# Patient Record
Sex: Female | Born: 1935 | Race: White | Hispanic: No | Marital: Married | Smoking: Never smoker
Health system: Southern US, Community
[De-identification: ages and names within clinical notes are randomized; demographics above are authoritative.]

## PROBLEM LIST (undated history)

## (undated) DIAGNOSIS — R011 Cardiac murmur, unspecified: Secondary | ICD-10-CM

## (undated) DIAGNOSIS — I739 Peripheral vascular disease, unspecified: Secondary | ICD-10-CM

## (undated) DIAGNOSIS — R0602 Shortness of breath: Secondary | ICD-10-CM

## (undated) DIAGNOSIS — K589 Irritable bowel syndrome without diarrhea: Secondary | ICD-10-CM

## (undated) DIAGNOSIS — G629 Polyneuropathy, unspecified: Secondary | ICD-10-CM

## (undated) DIAGNOSIS — R2681 Unsteadiness on feet: Secondary | ICD-10-CM

## (undated) DIAGNOSIS — E785 Hyperlipidemia, unspecified: Secondary | ICD-10-CM

## (undated) DIAGNOSIS — M1990 Unspecified osteoarthritis, unspecified site: Secondary | ICD-10-CM

## (undated) DIAGNOSIS — K5790 Diverticulosis of intestine, part unspecified, without perforation or abscess without bleeding: Secondary | ICD-10-CM

## (undated) DIAGNOSIS — G4700 Insomnia, unspecified: Secondary | ICD-10-CM

## (undated) DIAGNOSIS — F419 Anxiety disorder, unspecified: Secondary | ICD-10-CM

## (undated) HISTORY — PX: CATARACT EXTRACTION: SUR2

## (undated) HISTORY — PX: OTHER SURGICAL HISTORY: SHX169

## (undated) HISTORY — PX: CHOLECYSTECTOMY: SHX55

## (undated) HISTORY — PX: ABDOMINAL HYSTERECTOMY: SHX81

---

## 1998-02-05 ENCOUNTER — Other Ambulatory Visit: Admission: RE | Admit: 1998-02-05 | Discharge: 1998-02-05 | Payer: Self-pay | Admitting: Family Medicine

## 1998-06-06 ENCOUNTER — Ambulatory Visit (HOSPITAL_COMMUNITY): Admission: RE | Admit: 1998-06-06 | Discharge: 1998-06-06 | Payer: Self-pay | Admitting: Family Medicine

## 1999-05-02 ENCOUNTER — Other Ambulatory Visit: Admission: RE | Admit: 1999-05-02 | Discharge: 1999-05-02 | Payer: Self-pay | Admitting: Family Medicine

## 2000-02-23 ENCOUNTER — Emergency Department (HOSPITAL_COMMUNITY): Admission: EM | Admit: 2000-02-23 | Discharge: 2000-02-23 | Payer: Self-pay | Admitting: Emergency Medicine

## 2000-02-27 ENCOUNTER — Encounter (INDEPENDENT_AMBULATORY_CARE_PROVIDER_SITE_OTHER): Payer: Self-pay

## 2000-02-27 ENCOUNTER — Inpatient Hospital Stay (HOSPITAL_COMMUNITY): Admission: EM | Admit: 2000-02-27 | Discharge: 2000-03-02 | Payer: Self-pay | Admitting: *Deleted

## 2000-02-28 ENCOUNTER — Encounter: Payer: Self-pay | Admitting: *Deleted

## 2000-02-29 ENCOUNTER — Encounter: Payer: Self-pay | Admitting: *Deleted

## 2000-05-05 ENCOUNTER — Encounter (INDEPENDENT_AMBULATORY_CARE_PROVIDER_SITE_OTHER): Payer: Self-pay | Admitting: Specialist

## 2000-05-05 ENCOUNTER — Ambulatory Visit (HOSPITAL_COMMUNITY): Admission: RE | Admit: 2000-05-05 | Discharge: 2000-05-05 | Payer: Self-pay | Admitting: Gastroenterology

## 2002-04-01 ENCOUNTER — Emergency Department (HOSPITAL_COMMUNITY): Admission: EM | Admit: 2002-04-01 | Discharge: 2002-04-01 | Payer: Self-pay | Admitting: Emergency Medicine

## 2002-04-01 ENCOUNTER — Encounter: Payer: Self-pay | Admitting: *Deleted

## 2004-12-24 ENCOUNTER — Ambulatory Visit (HOSPITAL_COMMUNITY): Admission: RE | Admit: 2004-12-24 | Discharge: 2004-12-24 | Payer: Self-pay | Admitting: Gastroenterology

## 2007-04-24 ENCOUNTER — Emergency Department (HOSPITAL_COMMUNITY): Admission: EM | Admit: 2007-04-24 | Discharge: 2007-04-24 | Payer: Self-pay | Admitting: Emergency Medicine

## 2007-05-04 ENCOUNTER — Emergency Department (HOSPITAL_COMMUNITY): Admission: EM | Admit: 2007-05-04 | Discharge: 2007-05-04 | Payer: Self-pay | Admitting: Emergency Medicine

## 2010-03-05 ENCOUNTER — Encounter: Admission: RE | Admit: 2010-03-05 | Discharge: 2010-03-05 | Payer: Self-pay | Admitting: Internal Medicine

## 2010-11-14 NOTE — H&P (Signed)
[facility]  Patient:    Marie Cannon, Marie Cannon                        MRN: [id number] Adm. Date:  [id number] Attending:  Feliciana Rossetti                         History and Physical  CHIEF COMPLAINT:  "I turned yellow."  HISTORY OF PRESENT ILLNESS:  A 77 year old white female woke up this date of admission feeling mild malaise and was otherwise without complaints when she presented to her dentist, who stated that she was yellow and that she needed to proceed to her primary care physician right away.  The patient presented to my office on the morning of admission and upon questioning revealed that she had presented to the emergency room roughly six days earlier, where she was diagnosed with gastroesophageal reflux disease and treated with Protonix.  I had spoken with the emergency room doctor at this time.  Her presenting complaint to the emergency room was that of chest pain and chest squeezing. The patient denies nausea, vomiting, diarrhea.  She does endorse pale stools and Coca-Cola colored urine.  She denies headaches, focal numbness, paresthesias, or weakness.  PAST MEDICAL HISTORY:  Remote appendicitis.  PAST SURGICAL HISTORY:  Hysterectomy, appendectomy.  MEDICATIONS:  Premarin, Protonix.  ALLERGIES:  CODEINE.  REVIEW OF SYSTEMS:  No diplopia, no other visual changes.  No skin breakdown. See HPI.  PHYSICAL EXAMINATION:  GENERAL:  Nontoxic, in no apparent distress.  HEENT:  Pupils are equal, round and reactive to light.  Funduscopic exam reveals no exudate or hemorrhage, and normal disc edges.  The sclerae are icteric.  Mucous membranes are moist.  Oropharynx is without lesion.  NECK:  Supple.  No carotid bruits.  LYMPHATIC:  Lymph node survey negative.  CARDIOVASCULAR:  Regular rate and rhythm, no murmur, rub, or gallop.  CHEST:  Clear to auscultation.  Good, symmetric air movement.  ABDOMEN:  Soft, no splenomegaly.  Normal bowel sounds.   No rebound, no guard. Tenderness is palpated in right upper quadrant.  PELVIC, BREAST:  Deferred.  NEUROLOGIC:  Cranial nerves II-XII and muscle strength are within normal limits.  DTRs are 1 and symmetric.  RECTAL:  No occult blood and normal tone.  LABORATORY DATA:  Total bilirubin is elevated.  CBC and differential are otherwise normal.  Hepatic profile is pending at this time.  Liver enzymes are elevated.  Ultrasound reveals stone in biliary tree and multiple stones in the gallbladder.  ASSESSMENT AND PLAN: 1. Cholelithiasis.  Dr. Orson Slick consulted for planned surgical removal. 2. Choledocholithiasis, cholelithiasis.  Plan for ERCP by Dr. Loreta Ave. 3. Hepatitis.  Suspect strongly noninfectious and will await infectious    profile. DD:  02/28/00 TD:  03/01/00 Job: [id number]

## 2010-11-14 NOTE — Procedures (Signed)
[place]  Patient:    Marie Cannon, Marie Cannon                        MRN: [id number] Proc. Date: 05/05/00 Adm. Date:  [id number] Attending:  Charna Elizabeth CC:         Feliciana Rossetti, M.D.  Zigmund Daniel, M.D.   Procedure Report  DATE OF BIRTH:  1925-07-02  REFERRING PHYSICIAN:  Feliciana Rossetti, M.D.  PROCEDURE PERFORMED:  Colonoscopy with biopsies.  ENDOSCOPIST:  Anselmo Rod, M.D.  INSTRUMENT USED:  Olympus video colonoscope.  INDICATIONS FOR PROCEDURE:  Screening colonoscopy being performed in a 77 year old white female rule out colonic polyps, masses, hemorrhoids, etc.  PREPROCEDURE PREPARATION:  Informed consent was procured from the patient. The patient was fasted for eight hours prior to the procedure and prepped with a bottle of magnesium citrate and a gallon of NuLytely the night prior to the procedure.  PREPROCEDURE PHYSICAL:  The patient had stable vital signs.  Neck supple. Chest clear to auscultation.  S1, S2 regular.  Abdomen soft with normal abdominal bowel sounds.  DESCRIPTION OF PROCEDURE:  The patient was placed in the left lateral decubitus position and sedated with 100 mg of Demerol and 10 mg of Versed intravenously.  Once the patient was adequately sedated and maintained on low-flow oxygen and continuous cardiac monitoring, the Olympus video colonoscope was advanced from the rectum to the cecum without difficulty. The patient had some general fullness in the cecum around the appendicular orifice.  Biopsies were done from the appendicular orifice and from the cecal base for pathology.  No frank masses were seen.  It is important to rule out carcinoid syndrome under these circumstances.  Patient had prominent external hemorrhoids and small internal hemorrhoids.  IMPRESSION: 1. General fullness with prominent folds in the cecal base.  Biopsies done.    results pending. 2. Prominent external and small nonbleeding  internal hemorrhoids.  RECOMMENDATIONS: 1. Await pathology results. 2. Increase fluid and fiber in the diet. 3. Outpatient follow-up in the next two weeks. DD:  05/06/00 TD:  05/06/00 Job: [id number]

## 2010-11-14 NOTE — Consult Note (Signed)
[place]  Patient:    Marie Cannon, Marie Cannon                        MRN: [id number] Proc. Date: 02/27/00 Adm. Date:  [id number] Attending:  Meredith Leeds CC:         Feliciana Rossetti, M.D.  Zigmund Daniel, M.D.   Consultation Report  DATE OF BIRTH:  [birth year].  REASON FOR CONSULTATION:  Gallstones with stone in common bile duct.  Patient needs an ERCP before laparoscopic cholecystectomy.  ASSESSMENT: 1. Cholelithiasis with choledocholithiasis and CBD dilatation with jaundice. 2. Status post hysterectomy in 1977. 3. Status post appendectomy at age 49.  RECOMMENDATIONS: 1. Agree with ERCP prior to laparoscopic cholecystectomy 2. Clear liquid diet, n.p.o. after midnight for ERCP tomorrow. 3. Check UA, and PT and PTT.  DISCUSSION:  [name] is a very pleasant 77 year old white female who was in her usual state of health until Sunday, February 23, 2000, when she started experiencing some chest pain.  The patient was subsequently evaluated in the emergency room on February 24, 2000, for an MI, and had basic labs and EKG to rule out an MI.  The patient was advised that her symptoms may be secondary to GERD and was prescribed protonix.  The patient took the protonix for a day and noted some dark urine that night.  She also noted some clay colored stools the next morning.  The patient then presented to her dentist this morning who noticed her eyes to be jaundiced.  The patient was then seen on an emergent basis by Dr. Barbarann Ehlers in his office and sent for emergent ultrasound which showed that she had multiple gallstones, severe dilatation of up to 12 mm and a small stone in the mid common bile duct.  The patient denies any nausea and vomiting.  She did experience some right upper quadrant pain with rib soreness on the right side with radiation to the back.  She denies a previous history of ulcers, jaundice or colitis.  There is no history  of ______ or melena.  She averages 1 to 2 bowel movements per day without blood or mucous.  She denies a definite change in bowel habits.  There is no history of blood transfusions or tattoos.  There are no genitourinary or  cardiorespiratory complaints except for some urinary hesitancy which seems to have started today.  She has no history of headaches, there is no syncope.  There are ENT, dental or ocular complaints.  She denies any musculoskeletal, vascular, or neurologic problems.  ALLERGIES:  She is allergic to CODEINE which makes her nauseous.  MEDICATIONS: 1. Premarin 1.25 mg p.o. q. day. 2. Protonix 14 mg p.o. q. day. 3. Valium 5 mg q.h.s. 4. Corrector last night on a p.r.n. basis.  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  She has 2 grown children.  She denies the use of alcohol, tobacco or drugs.  Her husband is retired form an Scientist, forensic and they live in [place]. They have been married for 52 years.  FAMILY HISTORY:  The patients father died of a stroke in his 47s and the mother died of congestive heart failure in her 24s.  The patient denies a family history of cancer, heart disease or diabetes.  REVIEW OF SYSTEMS: 1. Yellow jaundice. 2. Right upper quadrant abdominal pain radiating to the back. 3. Dark colored urine, light colored  stools. 4. Some nausea without vomiting.  PHYSICAL EXAMINATION:  GENERAL:  Reveals a very pleasant, elderly white female, laying comfortably in bed.  VITAL SIGNS:  Temperature 97.2, blood pressure 173/77, respiratory rate 20, heart rate of 83 per minute.  Height of 5 feet 7 inches.  Weight of 174 pounds.  HEENT:  PERRLA.  Facial symmetry preserved.  The patient has icteric sclera. Oropharyngeal mucosa without exudate.  NECK:  Supple, no JVD, thyromegaly, lymphadenopathy.  CHEST:  Clear to auscultation.  S1, S2 regular.  No murmur, rub or gallop.  LUNGS:  No rhonchi or wheezing.  ABDOMEN:  Soft  with an appendectomy scar present right lower quadrant and a hysterectomy scar present transversely over the suprapubic area.  No significant tenderness on palpation of right right upper quadrant with no guarding, rebound in the region of hepatosplenomegaly, no masses palpable.  RECTAL:  Small internal hemorrhoid and guaiac negative brown stools.  No other masses are palpable on rectal exam.  PERIPHERAL PULSES: Normal.  CNS:  Nonfocal.  LABORATORY DATA:  Revealed white count of 8.6 k, hemoglobin 14.6 ______, hematocrit 40.4, MCV 86.5, platelet 286, 71% neutrophils, sodium 138, potassium 3.5, chloride 104, CO2 28, glucose 116, BUN 9, creatinine 0.9, total bilirubin 8.7, alk. phos. 227, AST 82, ALT 91, total protein 6.7, albumin 3.1, calcium 8.8.  Abdominal ultrasound done today shows multiple stones in the gallbladder with a 12 mm common bile duct and a small stone in the mid common bile duct.  There is minimal intrahepatic ductal dilatation of the left lobe.  Considering this data recommendations have been made.  Further recommendation will be made once the ERCP is done. DD:  02/27/00 TD:  03/01/00 Job: [id number]

## 2010-11-14 NOTE — Discharge Summary (Signed)
[facility]  Patient:    Marie Cannon, Marie Cannon                        MRN: [id number] Adm. Date:  [id number] Disc. Date: [id number] Attending:  Meredith Leeds CC:         Roosvelt Harps, M.D.  Petra Kuba, M.D.  Feliciana Rossetti, M.D.   Discharge Summary  HISTORY:  The patient is a 77 year old white female who was admitted because of severe upper abdominal pain.  Gallbladder ultrasound showed gallstones in the large common bile duct and stone in the bile duct.  The patient was jaundiced.  Dr. Quintella Reichert put her in the hospital and provided pain medication and antibiotics and consulted me.  The patient had mild upper abdominal tenderness.  She was obviously icteric.  HOSPITAL COURSE:  I requested that Dr. Loreta Ave see the patient because of the jaundice and obvious gallstones, but possibility of other causes of jaundice. Dr. Loreta Ave and I agreed that the patient needed preoperative ERCP. Dr. Luther Parody was engaged to do that and performed the procedure on September 1.  Sphincterotomy was performed, but he was unsuccessful in removal of the mid common duct stone.  Dr. Ewing Schlein repeated the procedure on September 2 and was able to remove the stones from the common duct.  I took the patient to the operating room on September 3 and did a laparoscopic cholecystectomy.  The procedure was difficult because of a great deal of chronic inflammation.  I was able to perform a cholangiogram which showed that the common duct was cleared well.  The patients jaundice abated.  On the morning after surgery, she was feeling well, feeding, and desiring discharge.  She had a drain in which I left in to be removed in the office in a few days.  This was present because of difficulty of dissection and uncertainty about control of the cystic duct.  It was not leaking any bile at the time of discharge.  Arrangements were made for her to see me or one of my associates in a few  days.  DISCHARGE DIAGNOSES: 1. Cholelithiasis. 2. choledocholithiasis. 3. Acute on chronic cholecystitis.  OPERATIONS: 1. ERCP x 2. 2. Laparoscopic cholecystectomy with cholangiogram.  DISCHARGE CONDITION:  Improved. DD:  03/10/00 TD:  03/11/00 Job: [id number]

## 2010-11-14 NOTE — Op Note (Signed)
Marie Cannon, Marie Cannon                  ACCOUNT NO.:  [id number]   MEDICAL RECORD NO.:  [id number]          PATIENT TYPE:  AMB   LOCATION:  ENDO                         FACILITY:  [facility]   PHYSICIAN:  Anselmo Rod, M.D.  DATE OF BIRTH:  1925-07-21   DATE OF PROCEDURE:  12/24/2004  DATE OF DISCHARGE:                                 OPERATIVE REPORT   PROCEDURE PERFORMED:  Screening colonoscopy.   ENDOSCOPIST:  Anselmo Rod, M.D.   INSTRUMENT USED:  Olympus video colonoscope.   INDICATIONS FOR PROCEDURE:  A 77 year old white female with a history of  rectal bleeding, rule out colonic polyps, masses, etc.   PREPROCEDURE PREPARATION:  Informed consent was procured from the patient.  The patient fasted for eight hours prior to the procedure and prepped with a  bottle of magnesium citrate and a gallon of GoLYTELY the night prior to the  procedure.  Risks and benefits of the procedure including a 10% miss rate of  cancer and polyps was discussed with the patient as well.   PREPROCEDURE PHYSICAL:  VITAL SIGNS:  Stable vital signs.  NECK:  Supple.  CHEST:  Clear to auscultation.  CARDIOVASCULAR:  S1 and S2 regular.  ABDOMEN:  Soft with normal bowel sounds.   DESCRIPTION OF PROCEDURE:  The patient was placed in left lateral decubitus  position, sedated with 75 mg of Demerol and 7.5 mg of Versed in slow  incremental doses.  Once the patient was adequately sedated and maintained  on low flow oxygen and continuous cardiac monitoring, the Olympus video  colonoscope was advanced from the rectum to the cecum.  The appendiceal  orifice and ileocecal valve were clearly visualized and photographed.  Small  internal hemorrhoids were seen.  No other masses or polyps were identified.  There were a few diverticula in the sigmoid colon.  The patient tolerated  the procedure well without any immediate complications.   IMPRESSION:  1.  Few sigmoid diverticula.  2.  Small internal hemorrhoids.  3.   Otherwise normal colonoscopy up to the cecum.   RECOMMENDATIONS:  1.  Continue a high fiber diet with liberal fluid intake.  2.  Repeat colonoscopy in the next 10 years unless the patient develops any      abnormal symptoms in the interim.  3.  Outpatient follow-up as need arises in the future.       JNM/MEDQ  D:  12/24/2004  T:  12/24/2004  Job:  [id number]   cc:   Barry Dienes. Eloise Harman, M.D.  [address]  [place]  [place] [address]  Fax: [phone]

## 2010-11-14 NOTE — Procedures (Signed)
[facility]  Patient:    Marie Cannon, Marie Cannon                        MRN: [id number] Proc. Date: 02/29/00 Adm. Date:  [id number] Attending:  Meredith Leeds CC:         Feliciana Rossetti, M.D.             Zigmund Daniel, M.D.             Roosvelt Harps, M.D.             Anselmo Rod, M.D.                           Procedure Report  PROCEDURE:  Endoscopic retrograde cholangiopancreatography with multiple stone extractions, mechanical lithotriptor, balloon pull-throughs.  Consent was signed risks, benefits, methods, and options were thoroughly discussed with the patient by Dr. Luther Parody on multiple occasions.  MEDICINES USED:  Demerol 190, Versed 18, droperidol 5.  DESCRIPTION OF PROCEDURE:  The video therapeutic duodenoscope was inserted by indirect vision into the stomach and advanced into the duodenum. The ampulla and the stent were brought into view which was easily snared and the stent was removed. We then went ahead and advanced the mechanical lithotriptor and injected dye through that and at least 2 common bile duct stones were seen. We then spent the better part of the next 2 hours trying to capture the stones in the basket and once pieces were removed sometimes requiring mechanical lithotriptor other times small pieces and debris were removed with the basket. We were able to finally clear the duct. We did use the Olympus mechanical lithotriptor on multiple occasions and one time used the [employer] conquest system with crushing small to medium sized stones and fragments on multiple occasions. Periodically in between multiple mechanical lithotriptors, we passed due to the 12 mm or the 8.5 mm balloons and debris and some sludge were removed on multiple ones of those. We still had one moderate sized stone present which did take lots of perseverance, but finally we were able to trap it in the basket and get adequate crushing. Three to four  normal balloon pull throughs were done. Although to get complete occlusion cholangiograms, it was difficult because of air remaining in the duct that we could not get out. On multiple balloon pull-throughs and occlusion cholangiograms at the end, no obvious residual stone was seen. Dr. Roxy Horseman of radiology assisted for part of the procedure as did Dr. Luther Parody. The scope was removed. The patient tolerated the procedure well and there was no obvious or immediate complication.  ENDOSCOPIC DIAGNOSIS: 1. Status post stent removal with the snare. 2. Two common bile duct stones status post multiple mechanical    lithotriptoring using both the [employer] system as well as the [employer] conquest system and also multiple balloons 8.5 and 12 mm. 3. No PD injections. 4. Believe only air inducted in the procedure.  PLAN:  Observed for delayed complications if none lap chole tomorrow. I have discussed the case with Dr. Orson Slick and Dr. Luther Parody. If a common bile duct stone was seen on intraop cholangiogram, I would not open her duct because I feel comfortable that we could be able to remove it at a later date endoscopically. I have discussed all of the above with the family who understands and will also tell him no aspirins or nonsteroidals  for 2 weeks. DD:  02/29/00 TD:  03/02/00 Job: [id number]

## 2010-11-14 NOTE — Procedures (Signed)
[facility]  Patient:    Marie Cannon, Marie Cannon                        MRN: [id number] Proc. Date: 02/28/00 Adm. Date:  [id number] Attending:  Meredith Leeds CC:         Feliciana Rossetti, M.D.  Zigmund Daniel, M.D.  Anselmo Rod, M.D.   Procedure Report  PROCEDURE:  ERCP.  SURGEON:  Roosvelt Harps, M.D.  INDICATIONS:  Obstructive jaundice and common bile duct stones in a 77 year old female.  PREPARATION:  She is n.p.o. since midnight.  PREPROCEDURE SEDATION:  She received a total of 150 mg of Demerol, 14 mg of Versed, and 5 mg of droperidol during the procedure to maintain sedation.  In addition, she was on 2 L of nasal cannula O2, and was premedicated with 2 g of IV cefotetan.  For small bowel relaxation, she received 1 mg of Glucagon and 0.5 mg of Levsin.  In addition, her throat was anesthetized with Hurricaine spray.  PREPROCEDURE ASSESSMENT:  I reviewed with the patient and her family the indications for the procedure, and the options of removal of these stones endoscopically versus an open cholecystectomy with common bile duct exploration.  I also discussed with them the potential risks of bleeding perforation and pancreatitis which can be very severe.  They all agreed to proceed.  PREPROCEDURE PHYSICAL EXAMINATION:  Revealed her to have a clear chest, normal heart sounds, and obese and soft abdomen with minimal epigastric and right upper quadrant tenderness to deep palpation.  DESCRIPTION OF PROCEDURE:  Following the above sedation, the patient was positioned on her stomach.  The Olympus video duodenoscope was inserted via the mouth and advanced easily through the upper esophageal sphincter. Intubation was then carried out to the descending duodenum.  The scope was withdrawn for visualization of the ampulla.  The ampulla was easily cannulated with the Tritome catheter.  However, multiple brief pancreatic injections were made  before the common bile duct was cannulated.  No secondary pancreatic ducts were filled however.  The guidewire was advanced well into the left hepatic duct and over this, the Tritome catheter was advanced for better injection into the common bile duct.  Three probable common bile duct stones were visualized.  A moderate-sized sphincterotomy was performed and a balloon catheter exchange with a 12 mm balloon was done.  The balloon catheter was advanced over the guidewire to the bifurcation of the duct, and dye was injected.  As it was withdrawn, the balloon was inflated and multiple attempts were made to extract the stone individually without success.  The catheter was again exchanged for the Tritome catheter, and the sphincterotomy was extended. A 15 mm balloon catheter was inserted and similarly, there was no success in removing the stones.  The catheter was again exchanged for the Tritome, and the sphincterotomy was extended one more time.  A 12 mm balloon catheter was finally exchanged and only the distal stone could be retrieved from the common bile duct.  The largest stone would not move pass the distal common bile duct despite multiple attempts at removal.  At this point, I elected to insert a 10-French, 5 cm stent which was placed without difficulty.  There was good bile drainage.  The patient will be kept on antibiotics and GI colleagues will be consulted regarding lithotripsy and further attempts at removal.  IMPRESSION:  Endoscopic retrograde cholangiopancreatography with sphincterotomy.  Successful only in removing  one of three common bile duct stones, stent left in place.  PLAN:  The patient will remain on a third generation cephalosporin and Flagyl, and be observed while the physicians involved in her care decided about further attempts at stone removal. DD:  02/28/00 TD:  03/01/00 Job: [id number]

## 2010-11-14 NOTE — Op Note (Signed)
[facility]  Patient:    Marie Cannon, Marie Cannon                        MRN: [id number] Proc. Date: 03/01/00 Adm. Date:  [id number] Attending:  Meredith Leeds CC:         Roosvelt Harps, M.D.             Petra Kuba, M.D.             Feliciana Rossetti, M.D.             Anselmo Rod, M.D.                           Operative Report  PREOPERATIVE DIAGNOSES:  Cholelithiasis and chronic cholecystitis.  POSTOPERATIVE DIAGNOSES:  Cholelithiasis and chronic cholecystitis.  OPERATION PERFORMED:  Laparoscopic cholecystectomy and cholangiogram.  SURGEON:  Dr. Orson Slick.  ASSISTANT:  Dr. Magnus Ivan.  ANESTHESIA:  General.  DESCRIPTION OF PROCEDURE:  After adequate monitoring and induction of anesthesia on routine preparation and draping of the abdomen, I made a short transverse infraumbilical incision, dissected down to the fascia, opened it sharply and opened the peritoneum bluntly and entered the abdominal cavity. There were some adhesions toward the lower abdomen but none in the upper abdomen which could be palpated. I placed an #0 Vicryl pursestring suture in the fascia and secured the Hasson cannulated and inflated the abdomen with CO2. I then looked around and noticed that there were adhesions to the under surface of the gallbladder mostly omentum. I placed 3 additional ports and with the patient positioned head up, foot down and tilted to the left, I took down some of the adhesions until I was able to grasp the gallbladder. The gallbladder was very contracted, filled with stones and very inflamed. Dissection both along the surface of the gallbladder and in the hepatoduodenal ligament was difficult. I incised the hepatoduodenal ligament, retracting the infundibulum of the gallbladder laterally. I was able to identify the cystic artery and clip it with 3 clips and divided it between the 2 closer to the gallbladder. It was difficult to dissect out the cystic  duct. I stayed up high on the infundibulum of the gallbladder in that dissection and in doing so made a hole in the infundibulum of the gallbladder and removed several gallstones. I put a cholangiogram catheter through that hole and down into the cystic duct and secured it and did a cholangiogram. It showed that the cystic duct had plenty of length and that there was good filling of the intrahepatic radicles and rapid drainage into the duodenum. I could not identify any retained gallstone fragments in the common bile duct. I removed the cholangiogram catheter and then dissected further. I was finally able to encircle the cystic duct and as I was attempting to clip it, it divided spontaneously. Dr. Magnus Ivan scrubbed in and assisted me with the operation and we very carefully dissected a little farther down along the cystic duct. I picked it up and applied 2 clips taking care to avoid any injury to the common bile duct. I then dissected the gallbladder from the liver with a good deal of difficulty because of all the scar tissue. I made several holes in the gallbladder and removed several gallstones and do not believe I left any stones in the abdomen. I removed the gallbladder from the body through the umbilical incision after placing it into  a plastic pouch and then I put the umbilical port back in and put the camera back in the umbilical port, copiously irrigated the right upper quadrant and removed the irrigant. I got hemostasis in the gallbladder fossa with the Bovie. I checked to see that all the clips were secure. I placed a round Blake drain into the abdomen and brought it out through one of the lateral port sites and secured it to the stem. I had pre-cut it to the necessary length and I placed it down into the gallbladder fossa in a comfortable position. After being assured that that was in good position, I released the CO2 and removed the ports and tied the pursestring suture in the  umbilicus. I anesthetized all the incisions. I closed the skin of all incisions with intracuticular 4-0 Vicryl and Steri-Strips. The patient tolerated the operation well. She went to PACU in stable condition. DD:  03/01/00 TD:  03/02/00 Job: [id number]

## 2012-03-02 ENCOUNTER — Observation Stay (HOSPITAL_COMMUNITY)
Admission: EM | Admit: 2012-03-02 | Discharge: 2012-03-03 | Disposition: A | Discharge status: Home / Self Care | Payer: Medicare Other | Attending: Internal Medicine | Admitting: Internal Medicine

## 2012-03-02 ENCOUNTER — Emergency Department (HOSPITAL_COMMUNITY): Payer: Medicare Other

## 2012-03-02 ENCOUNTER — Encounter (HOSPITAL_COMMUNITY): Payer: Self-pay | Admitting: *Deleted

## 2012-03-02 DIAGNOSIS — F419 Anxiety disorder, unspecified: Secondary | ICD-10-CM | POA: Diagnosis present

## 2012-03-02 DIAGNOSIS — R2681 Unsteadiness on feet: Secondary | ICD-10-CM | POA: Diagnosis present

## 2012-03-02 DIAGNOSIS — I739 Peripheral vascular disease, unspecified: Secondary | ICD-10-CM

## 2012-03-02 DIAGNOSIS — R42 Dizziness and giddiness: Secondary | ICD-10-CM | POA: Diagnosis present

## 2012-03-02 DIAGNOSIS — R0609 Other forms of dyspnea: Principal | ICD-10-CM | POA: Insufficient documentation

## 2012-03-02 DIAGNOSIS — K589 Irritable bowel syndrome without diarrhea: Secondary | ICD-10-CM | POA: Diagnosis present

## 2012-03-02 DIAGNOSIS — G4700 Insomnia, unspecified: Secondary | ICD-10-CM | POA: Diagnosis present

## 2012-03-02 DIAGNOSIS — F411 Generalized anxiety disorder: Secondary | ICD-10-CM | POA: Insufficient documentation

## 2012-03-02 DIAGNOSIS — E785 Hyperlipidemia, unspecified: Secondary | ICD-10-CM

## 2012-03-02 DIAGNOSIS — R55 Syncope and collapse: Secondary | ICD-10-CM

## 2012-03-02 DIAGNOSIS — R0600 Dyspnea, unspecified: Secondary | ICD-10-CM | POA: Diagnosis present

## 2012-03-02 DIAGNOSIS — R0989 Other specified symptoms and signs involving the circulatory and respiratory systems: Principal | ICD-10-CM | POA: Insufficient documentation

## 2012-03-02 DIAGNOSIS — R0602 Shortness of breath: Secondary | ICD-10-CM | POA: Diagnosis present

## 2012-03-02 DIAGNOSIS — R269 Unspecified abnormalities of gait and mobility: Secondary | ICD-10-CM | POA: Insufficient documentation

## 2012-03-02 HISTORY — DX: Irritable bowel syndrome, unspecified: K58.9

## 2012-03-02 HISTORY — DX: Unspecified osteoarthritis, unspecified site: M19.90

## 2012-03-02 HISTORY — DX: Insomnia, unspecified: G47.00

## 2012-03-02 HISTORY — DX: Peripheral vascular disease, unspecified: I73.9

## 2012-03-02 HISTORY — DX: Shortness of breath: R06.02

## 2012-03-02 HISTORY — DX: Anxiety disorder, unspecified: F41.9

## 2012-03-02 HISTORY — DX: Diverticulosis of intestine, part unspecified, without perforation or abscess without bleeding: K57.90

## 2012-03-02 HISTORY — DX: Hyperlipidemia, unspecified: E78.5

## 2012-03-02 HISTORY — DX: Polyneuropathy, unspecified: G62.9

## 2012-03-02 HISTORY — DX: Unsteadiness on feet: R26.81

## 2012-03-02 HISTORY — DX: Cardiac murmur, unspecified: R01.1

## 2012-03-02 LAB — CBC WITH DIFFERENTIAL/PLATELET
Basophils Relative: 0 % (ref 0–1)
Eosinophils Absolute: 0.2 10*3/uL (ref 0.0–0.7)
Eosinophils Relative: 3 % (ref 0–5)
Hemoglobin: 15.4 g/dL — ABNORMAL HIGH (ref 12.0–15.0)
MCH: 30.6 pg (ref 26.0–34.0)
MCHC: 34.9 g/dL (ref 30.0–36.0)
Monocytes Absolute: 0.7 10*3/uL (ref 0.1–1.0)
Monocytes Relative: 8 % (ref 3–12)
Neutrophils Relative %: 53 % (ref 43–77)

## 2012-03-02 LAB — TROPONIN I: Troponin I: <0.3 ng/mL (ref <0.30)

## 2012-03-02 LAB — CREATININE, SERUM
Creatinine, Ser: 1.17 mg/dL — ABNORMAL HIGH (ref 0.50–1.10)
GFR calc Af Amer: 48 mL/min — ABNORMAL LOW (ref >90)
GFR calc non Af Amer: 42 mL/min — ABNORMAL LOW (ref >90)

## 2012-03-02 LAB — URINALYSIS, ROUTINE W REFLEX MICROSCOPIC
Glucose, UA: NEGATIVE mg/dL
Hgb urine dipstick: NEGATIVE
Protein, ur: NEGATIVE mg/dL
Specific Gravity, Urine: 1.011 (ref 1.005–1.030)
Urobilinogen, UA: 0.2 mg/dL (ref 0.0–1.0)

## 2012-03-02 LAB — CBC
MCV: 89.1 fL (ref 78.0–100.0)
Platelets: 202 10*3/uL (ref 150–400)
RBC: 4.77 MIL/uL (ref 3.87–5.11)
RDW: 12.6 % (ref 11.5–15.5)
WBC: 8.5 10*3/uL (ref 4.0–10.5)

## 2012-03-02 LAB — BASIC METABOLIC PANEL
BUN: 25 mg/dL — ABNORMAL HIGH (ref 6–23)
GFR calc non Af Amer: 37 mL/min — ABNORMAL LOW (ref >90)
Glucose, Bld: 174 mg/dL — ABNORMAL HIGH (ref 70–99)
Potassium: 3.3 mEq/L — ABNORMAL LOW (ref 3.5–5.1)

## 2012-03-02 LAB — URINE MICROSCOPIC-ADD ON

## 2012-03-02 MED ORDER — DIAZEPAM 5 MG PO TABS
5.0000 mg | ORAL_TABLET | Freq: Every day | ORAL | Status: DC
  Administered 2012-03-02: 5 mg via ORAL
  Filled 2012-03-02: qty 1

## 2012-03-02 MED ORDER — SODIUM CHLORIDE 0.9 % IJ SOLN
3.0000 mL | Freq: Two times a day (BID) | INTRAMUSCULAR | Status: DC
  Administered 2012-03-02 – 2012-03-03 (×2): 3 mL via INTRAVENOUS

## 2012-03-02 MED ORDER — AMLODIPINE BESYLATE 2.5 MG PO TABS
2.5000 mg | ORAL_TABLET | Freq: Every day | ORAL | Status: DC
Start: 2012-03-02 — End: 2012-03-03
  Administered 2012-03-02 – 2012-03-03 (×2): 2.5 mg via ORAL
  Filled 2012-03-02 (×2): qty 1

## 2012-03-02 MED ORDER — POTASSIUM CHLORIDE IN NACL 20-0.9 MEQ/L-% IV SOLN
INTRAVENOUS | Status: DC
  Administered 2012-03-02 – 2012-03-03 (×2): via INTRAVENOUS
  Filled 2012-03-02 (×3): qty 1000

## 2012-03-02 MED ORDER — SODIUM CHLORIDE 0.9 % IV SOLN: INTRAVENOUS | Status: DC

## 2012-03-02 MED ORDER — ENOXAPARIN SODIUM 40 MG/0.4ML ~~LOC~~ SOLN
40.0000 mg | SUBCUTANEOUS | Status: DC
  Administered 2012-03-02: 40 mg via SUBCUTANEOUS
  Filled 2012-03-02 (×2): qty 0.4

## 2012-03-02 MED ORDER — NORTRIPTYLINE HCL 25 MG PO CAPS
25.0000 mg | ORAL_CAPSULE | Freq: Every day | ORAL | Status: DC
  Administered 2012-03-02: 25 mg via ORAL
  Filled 2012-03-02 (×2): qty 1

## 2012-03-02 MED ORDER — SENNOSIDES-DOCUSATE SODIUM 8.6-50 MG PO TABS
1.0000 | ORAL_TABLET | Freq: Every day | ORAL | Status: DC | PRN
  Filled 2012-03-02: qty 1

## 2012-03-02 MED ORDER — ASPIRIN EC 325 MG PO TBEC
325.0000 mg | DELAYED_RELEASE_TABLET | Freq: Every day | ORAL | Status: DC
  Administered 2012-03-02 – 2012-03-03 (×2): 325 mg via ORAL
  Filled 2012-03-02 (×2): qty 1

## 2012-03-02 MED ORDER — GABAPENTIN 300 MG PO CAPS
900.0000 mg | ORAL_CAPSULE | Freq: Every day | ORAL | Status: DC
  Administered 2012-03-02: 900 mg via ORAL
  Filled 2012-03-02 (×2): qty 3

## 2012-03-02 MED ORDER — ALUM & MAG HYDROXIDE-SIMETH 200-200-20 MG/5ML PO SUSP: 30.0000 mL | Freq: Four times a day (QID) | ORAL | Status: DC | PRN

## 2012-03-02 MED ORDER — ZOLPIDEM TARTRATE 5 MG PO TABS: 5.0000 mg | ORAL_TABLET | Freq: Every evening | ORAL | Status: DC | PRN

## 2012-03-02 MED ORDER — LEVOTHYROXINE SODIUM 88 MCG PO TABS
88.0000 ug | ORAL_TABLET | Freq: Every day | ORAL | Status: DC
  Administered 2012-03-03: 88 ug via ORAL
  Filled 2012-03-02 (×2): qty 1

## 2012-03-02 MED ORDER — PROMETHAZINE HCL 25 MG PO TABS: 12.5000 mg | ORAL_TABLET | Freq: Four times a day (QID) | ORAL | Status: DC | PRN

## 2012-03-02 MED ORDER — ALPRAZOLAM 0.25 MG PO TABS: 0.2500 mg | ORAL_TABLET | Freq: Three times a day (TID) | ORAL | Status: DC | PRN

## 2012-03-02 MED ORDER — ACETAMINOPHEN 650 MG RE SUPP: 650.0000 mg | Freq: Four times a day (QID) | RECTAL | Status: DC | PRN

## 2012-03-02 MED ORDER — ACETAMINOPHEN 325 MG PO TABS: 650.0000 mg | ORAL_TABLET | Freq: Four times a day (QID) | ORAL | Status: DC | PRN

## 2012-03-02 NOTE — H&P (Signed)
Marie Cannon is an 78 y.o. female.   Chief Complaint: marked difficulty breathing HPI:  The patient is an 78 year old Caucasian woman with several medical problems who was in her usual state of fairly good health until about 2 weeks ago when she began to have episodes of dyspnea and lightheadedness.  The episodes of sudden and associated with lightheadedness, moderate dyspnea on exertion, and chest heaviness.  She has had these symptoms for much of the morning today.  She denied recent fever, chills, productive cough, and has no history of DVT or pulmonary embolism.  She also has no clear history of heart disease.  In May 2013 she had of bilateral lower extremity arterial ultrasound exam that showed evidence of an occluded popliteal artery on the right side with a right ankle brachial index of 0.47. We had recommended A vascular surgery consultation, however this did not happen for unclear reasons.  She also has an history of atypical chest pain in 2003 with the Cardiolite exercise test not showing ischemia at that time.  In September 2011 she had TIAs with labs and carotid ultrasound unremarkable. She is under great deal of stress caring for her husband who has legal blindness because of macular degeneration, and this has increased her level of anxiety lately.  She was very concerned about his care with her going in the hospital.  Past Medical History  Diagnosis Date  . Gait instability   . Insomnia   . Anxiety   . IBS (irritable bowel syndrome)   . Diverticulosis   . Shortness of breath   . Heart murmur   . Peripheral vascular disease   . Arthritis   . Dyslipidemia   . Peripheral neuropathy     Medications Prior to Admission  Medication Sig Dispense Refill  . amLODipine (NORVASC) 2.5 MG tablet Take 2.5 mg by mouth daily.      . diazepam (VALIUM) 5 MG tablet Take 5 mg by mouth at bedtime.      . gabapentin (NEURONTIN) 300 MG capsule Take 900 mg by mouth at bedtime.      Marland Kitchen levothyroxine  (SYNTHROID, LEVOTHROID) 88 MCG tablet Take 88 mcg by mouth daily.      . nortriptyline (PAMELOR) 25 MG capsule Take 25 mg by mouth at bedtime.      Marland Kitchen DISCONTD: Cyanocobalamin (VITAMIN B 12 PO) Take 1 tablet by mouth daily.      Marland Kitchen DISCONTD: valsartan-hydrochlorothiazide (DIOVAN-HCT) 320-25 MG per tablet Take 1 tablet by mouth daily at 12 noon.         ADDITIONAL HOME MEDICATIONS: Valium 5 mg by mouth daily at bedtime, vitamin D 400 units by mouth daily, vitamin B12 take one tablet by mouth daily, Ecotrin 81 mg by mouth daily, gabapentin 300 mg take 3 capsules by mouth daily at bedtime, nortriptyline 25 mg by mouth daily at bedtime, Diovan HCT 320/25 take 1 tab by mouth every a.m., amlodipine 2.5 mg daily, Detrol LA 2 mg by mouth daily, Synthroid 88 g by mouth daily,  PHYSICIANS INVOLVED IN CARE: Jyothi Mann (GI), Claudette Head (oph)  Past Surgical History  Procedure Date  . Cholecystectomy   . Cataract extraction   . Tah and bso   . Abdominal hysterectomy     History reviewed. No pertinent family history.   Social History:  reports that she has never smoked. She does not have any smokeless tobacco history on file. She reports that she does not drink alcohol. Her drug history not on  file.  Allergies:  Allergies  Allergen Reactions  . Codeine Nausea And Vomiting     ROS: ankle swelling, arthritis, cataracts, chest pain, high blood pressure and shortness of breath  PHYSICAL EXAM: Blood pressure 124/77, pulse 74, temperature 98 F (36.7 C), temperature source Oral, resp. rate 20, height 5\' 6"  (1.676 m), weight 85.4 kg (188 lb 4.4 oz), SpO2 98.00%. In general, she is able mildly overweight white woman who was anxious with tachypnea in the office.  HEENT exam was within normal limits, neck was supple without jugular venous distention or carotid bruit, chest was clear to auscultation, heart had any regular rate and rhythm without significant murmur or gallop, abdomen had normal bowel sounds  and no hepatosplenomegaly or tenderness, extremities had bilateral trace ankle edema.  The right pedal pulses were not palpable and toe tip capillary refill occurs in less than 2 seconds, and the left pedal pulses were 1+.  She is alert and well oriented and somewhat anxious, cranial nerves II through XII were within normal limits, motor strength was normal in all 4 limbs, gait was normal.  Results for orders placed during the hospital encounter of 03/02/12 (from the past 48 hour(s))  BASIC METABOLIC PANEL     Status: Abnormal   Collection Time   03/02/12  2:20 PM      Component Value Range Comment   Sodium 137  135 - 145 mEq/L    Potassium 3.3 (*) 3.5 - 5.1 mEq/L    Chloride 96  96 - 112 mEq/L    CO2 28  19 - 32 mEq/L    Glucose, Bld 174 (*) 70 - 99 mg/dL    BUN 25 (*) 6 - 23 mg/dL    Creatinine, Ser 4.13 (*) 0.50 - 1.10 mg/dL    Calcium 24.4  8.4 - 10.5 mg/dL    GFR calc non Af Amer 37 (*) >90 mL/min    GFR calc Af Amer 43 (*) >90 mL/min   CBC WITH DIFFERENTIAL     Status: Abnormal   Collection Time   03/02/12  2:20 PM      Component Value Range Comment   WBC 8.9  4.0 - 10.5 K/uL    RBC 5.03  3.87 - 5.11 MIL/uL    Hemoglobin 15.4 (*) 12.0 - 15.0 g/dL    HCT 01.0  27.2 - 53.6 %    MCV 87.7  78.0 - 100.0 fL    MCH 30.6  26.0 - 34.0 pg    MCHC 34.9  30.0 - 36.0 g/dL    RDW 64.4  03.4 - 74.2 %    Platelets 222  150 - 400 K/uL    Neutrophils Relative 53  43 - 77 %    Neutro Abs 4.7  1.7 - 7.7 K/uL    Lymphocytes Relative 36  12 - 46 %    Lymphs Abs 3.2  0.7 - 4.0 K/uL    Monocytes Relative 8  3 - 12 %    Monocytes Absolute 0.7  0.1 - 1.0 K/uL    Eosinophils Relative 3  0 - 5 %    Eosinophils Absolute 0.2  0.0 - 0.7 K/uL    Basophils Relative 0  0 - 1 %    Basophils Absolute 0.0  0.0 - 0.1 K/uL   POCT I-STAT TROPONIN I     Status: Normal   Collection Time   03/02/12  2:40 PM      Component Value Range Comment  Troponin i, poc 0.01  0.00 - 0.08 ng/mL    Comment 3            PRO B  NATRIURETIC PEPTIDE     Status: Normal   Collection Time   03/02/12  3:00 PM      Component Value Range Comment   Pro B Natriuretic peptide (BNP) 188.4  0 - 450 pg/mL   D-DIMER, QUANTITATIVE     Status: Abnormal   Collection Time   03/02/12  3:41 PM      Component Value Range Comment   D-Dimer, Quant 0.85 (*) 0.00 - 0.48 ug/mL-FEU    Dg Chest 2 View  03/02/2012  *RADIOLOGY REPORT*  Clinical Data: Shortness of breath, weakness  CHEST - 2 VIEW  Comparison: None.  Findings: Cardiomediastinal silhouette is unremarkable.  No acute infiltrate or pulmonary edema.  Mild basilar atelectasis.  Mild thoracic spine osteopenia.  IMPRESSION:  No acute infiltrate or pulmonary edema.  Mild basilar atelectasis. Mild thoracic spine osteopenia.   Original Report Authenticated By: Natasha Mead, M.D.    EKG showed normal sinus rhythm with low precordial R wave amplitude, no signs of ischemia  Assessment/Plan #1 Dyspnea: Moderately severe and of uncertain cause.  Her symptoms could be from atypical angina given her history of peripheral vascular disease.  Alternatively, her symptoms could be from an acute pulmonary embolism given that she has an elevated d-dimer level.  We will check serial cardiac isoenzymes as well as a repeat EKG in the morning.  In addition, we'll check a CT angiogram of the chest after she is better hydrated if her renal function improves, to exclude the possibility of pulmonary embolism. If her renal function does not improve with hydration then we will do a nuclear medicine ventilation perfusion scan. #2 Acute Renal Insufficiency: She has mild increase in her serum creatinine level that could be from mild dehydration.  We will give IV fluids tonight  Eitan Doubleday G 03/02/2012, 6:45 PM

## 2012-03-02 NOTE — Progress Notes (Signed)
Disposition Note  Marie Cannon, is a 78 y.o. female,   MRN: [id number]  -  DOB - 1926/01/08  Outpatient Primary MD for the patient is Garlan Fillers, MD   Blood pressure 122/61, pulse 70, temperature 98 F (36.7 C), temperature source Oral, resp. rate 19, SpO2 97.00%.  Active Problems:  Dizzy  SOB (shortness of breath)  Gait instability  Insomnia  Anxiety  IBS (irritable bowel syndrome)    78 yo female with pmh that includes gait instability and insomnia, presents to the ED from her PCP's office with Dizziness and SOB almost passing out.  She reports that she has been having episodes like this each day for the past three weeks.  They usually last for about an hour and then go away.    At her PCP's office she was given oxygen which improved her symptoms.  In the ED her creatinine is slightly elevated 1.29 with a BUN of 25 - possibly indicating dehydration.  Her glucose is elevated 174 - she has no history of DM listed.  Further her D-Dimer is elevated at 0.85.  No CT Angio has been ordered.  Her EKG is low voltage and shows a prolonged QT.  I have requested a telemetry bed, observation admission for presyncopal symptoms.  I believe the patient may be dehydrated & this is being exacerbated by her Diovan HCT.  I have also requested a urinalysis and orthostatic vital signs.  Algis Downs, PA-C [facility] Pager: [phone]

## 2012-03-02 NOTE — ED Provider Notes (Signed)
History     CSN: [id number]  Arrival date & time 03/02/12  1322   First MD Initiated Contact with Patient 03/02/12 1401      Chief Complaint  Patient presents with  . Shortness of Breath  . Dizziness    (Consider location/radiation/quality/duration/timing/severity/associated sxs/prior treatment) HPI Comments: Marie Cannon 78 y.o. female   The chief complaint is: Patient presents with:   Shortness of Breath   Dizziness    78 year old female presents today with a chief complaint of shortness of breath. She was sent over by her primary care physician, Dr. Ivery Quale. She has a history of peripheral arterial disease, hypothyroidism, and anxiety. She states that she has had intermittent bouts of sudden onset shortness of breath during activity like standing and cooking. These episodes are relieved by lying flat or laying down and resting. It happens once or twice a day. Over the past 2-3 weeksr.She has had some episodes of presyncope. She is also had some intermittent chest tightness, which she feels radiates down her left arm peer. she denies any racing or skipping heart.  She denies any blood loss including melena or hematochezia. She states that she has had some loss of stool, but denies overflow incontinence, or saddle anesthesia. She denies back pain. Denies fevers, chills, myalgias, arthralgias, nausea, vomiting, diarrhea.    Patient is a 78 y.o. female presenting with shortness of breath. The history is provided by the patient, a relative and the spouse. No language interpreter was used.  Shortness of Breath  The current episode started 2 days ago. The problem occurs frequently. The problem has been unchanged. The symptoms are relieved by rest. The symptoms are aggravated by activity. Associated symptoms include shortness of breath. Pertinent negatives include no chest pain, no orthopnea, no fever, no rhinorrhea, no sore throat, no stridor, no cough and no wheezing. Chest  pressure: chest tightness. Urine output has been normal.    Past Medical History  Diagnosis Date  . Gait instability   . Insomnia   . Anxiety   . IBS (irritable bowel syndrome)   . Diverticulosis     Past Surgical History  Procedure Date  . Cholecystectomy   . Cataract extraction     History reviewed. No pertinent family history.  History  Substance Use Topics  . Smoking status: Never Smoker   . Smokeless tobacco: Not on file  . Alcohol Use: No    OB History    Grav Para Term Preterm Abortions TAB SAB Ect Mult Living                  Review of Systems  Constitutional: Negative for fever, chills and fatigue.  HENT: Negative for sore throat and rhinorrhea.   Respiratory: Positive for chest tightness and shortness of breath. Negative for cough, wheezing and stridor.   Cardiovascular: Negative for chest pain, palpitations, orthopnea and leg swelling.  Gastrointestinal: Negative for nausea, vomiting, abdominal pain, diarrhea and blood in stool.  Genitourinary: Negative for dysuria and vaginal bleeding.  Musculoskeletal: Negative for back pain.  Skin: Negative for pallor.  Neurological: Positive for weakness and light-headedness. Negative for syncope.    Allergies  Codeine  Home Medications   Current Outpatient Rx  Name Route Sig Dispense Refill  . AMLODIPINE BESYLATE 2.5 MG PO TABS Oral Take 2.5 mg by mouth daily.    . CHOLECALCIFEROL 400 UNITS PO TABS Oral Take 400 Units by mouth daily.    Marland Kitchen VITAMIN B 12 PO Oral Take  1 tablet by mouth daily.    Marland Kitchen DIAZEPAM 5 MG PO TABS Oral Take 5 mg by mouth at bedtime.    Marland Kitchen GABAPENTIN 300 MG PO CAPS Oral Take 900 mg by mouth at bedtime.    Marland Kitchen LEVOTHYROXINE SODIUM 88 MCG PO TABS Oral Take 88 mcg by mouth daily.    Marland Kitchen NORTRIPTYLINE HCL 25 MG PO CAPS Oral Take 25 mg by mouth at bedtime.    . TOLTERODINE TARTRATE ER 2 MG PO CP24 Oral Take 2 mg by mouth daily.    Marland Kitchen VALSARTAN-HYDROCHLOROTHIAZIDE 320-25 MG PO TABS Oral Take 1 tablet by  mouth daily.      BP 122/61  Pulse 70  Temp 98 F (36.7 C) (Oral)  Resp 19  SpO2 97%  Physical Exam  Nursing note and vitals reviewed. Constitutional: She is oriented to person, place, and time. No distress.  HENT:  Head: Normocephalic and atraumatic.  Eyes: Conjunctivae are normal.  Neck: Normal range of motion. Neck supple. Thyromegaly present.  Cardiovascular: Normal rate, regular rhythm and normal heart sounds.   No murmur heard. Pulmonary/Chest: Effort normal. No respiratory distress. She has no wheezes. She has no rales. She exhibits no tenderness.        speaking in 2-3 word sentences. Lungs are clear to auscultation bilaterally.No Accessory muscle use.  Abdominal: Soft. Bowel sounds are normal. She exhibits no distension and no mass. There is no tenderness. There is no guarding.  Musculoskeletal: Normal range of motion.  Lymphadenopathy:    She has no cervical adenopathy.  Neurological: She is alert and oriented to person, place, and time.  Skin: Skin is warm and dry.    ED Course  Procedures (including critical care time)  Results for orders placed during the hospital encounter of 03/02/12  BASIC METABOLIC PANEL      Component Value Range   Sodium 137  135 - 145 mEq/L   Potassium 3.3 (*) 3.5 - 5.1 mEq/L   Chloride 96  96 - 112 mEq/L   CO2 28  19 - 32 mEq/L   Glucose, Bld 174 (*) 70 - 99 mg/dL   BUN 25 (*) 6 - 23 mg/dL   Creatinine, Ser 1.61 (*) 0.50 - 1.10 mg/dL   Calcium 09.6  8.4 - 04.5 mg/dL   GFR calc non Af Amer 37 (*) >90 mL/min   GFR calc Af Amer 43 (*) >90 mL/min  CBC WITH DIFFERENTIAL      Component Value Range   WBC 8.9  4.0 - 10.5 K/uL   RBC 5.03  3.87 - 5.11 MIL/uL   Hemoglobin 15.4 (*) 12.0 - 15.0 g/dL   HCT 40.9  81.1 - 91.4 %   MCV 87.7  78.0 - 100.0 fL   MCH 30.6  26.0 - 34.0 pg   MCHC 34.9  30.0 - 36.0 g/dL   RDW 78.2  95.6 - 21.3 %   Platelets 222  150 - 400 K/uL   Neutrophils Relative 53  43 - 77 %   Neutro Abs 4.7  1.7 - 7.7 K/uL     Lymphocytes Relative 36  12 - 46 %   Lymphs Abs 3.2  0.7 - 4.0 K/uL   Monocytes Relative 8  3 - 12 %   Monocytes Absolute 0.7  0.1 - 1.0 K/uL   Eosinophils Relative 3  0 - 5 %   Eosinophils Absolute 0.2  0.0 - 0.7 K/uL   Basophils Relative 0  0 - 1 %  Basophils Absolute 0.0  0.0 - 0.1 K/uL  POCT I-STAT TROPONIN I      Component Value Range   Troponin i, poc 0.01  0.00 - 0.08 ng/mL   Comment 3           PRO B NATRIURETIC PEPTIDE      Component Value Range   Pro B Natriuretic peptide (BNP) 188.4  0 - 450 pg/mL     Dg Chest 2 View  03/02/2012  *RADIOLOGY REPORT*  Clinical Data: Shortness of breath, weakness  CHEST - 2 VIEW  Comparison: None.  Findings: Cardiomediastinal silhouette is unremarkable.  No acute infiltrate or pulmonary edema.  Mild basilar atelectasis.  Mild thoracic spine osteopenia.  IMPRESSION:  No acute infiltrate or pulmonary edema.  Mild basilar atelectasis. Mild thoracic spine osteopenia.   Original Report Authenticated By: Natasha Mead, M.D.    Patient  labs show no elevated troponin. EKG is normal except for low voltage. She does not have signs of cardiac tamponade and it is not apparent on her chest x-ray. She has an elevated hemoglobin of 15.4. Potassium is slightly low at 3.3. Do not feel she needs oral replenishment of potassium at this time. Glucose is mildly elevated to 174. BNP is within normal range . Seen this patient in conjunction with Dr. Ignacia Palma. We've called for admission of the patient for shortness of breath and presyncopal events.   1. SOB (shortness of breath)       MDM  She will be admitted.        Arthor Captain, PA-C 03/02/12 1610  Arthor Captain, PA-C 03/02/12 2305  Arthor Captain, PA-C 03/02/12 2306

## 2012-03-02 NOTE — Progress Notes (Signed)
Date: 03/02/2012  Rate: 78  Rhythm: normal sinus rhythm  QRS Axis: normal  Intervals: QT prolonged QRS:  Low voltage in all leads.  ST/T Wave abnormalities: nonspecific T wave changes  Conduction Disutrbances:none  Narrative Interpretation: Abnormal EKG  Old EKG Reviewed: none available

## 2012-03-02 NOTE — ED Notes (Signed)
Pt reports she has been having "spells" for the last 2 weeks, when asked more about it she said she has intermittent episodes of sob and dizziness. States will last a while and then she will be fine. Pt was seen at doctors office and sent here for admission? Pt denies any chest pain.

## 2012-03-02 NOTE — Progress Notes (Signed)
78 yo woman has been having episodes of shortness of breath that are quite severe, leading to near syncope, and lasting about an hour before abating, at least daily for 3 weeks.  She was sent in from Dr. Georgiann Hahn office for evaluation.  Physical exam was nonrevealing, as was EKG, chest x-ray, cardiac markers and BNP.  Call to [facility] to admit her for observation of near syncope.

## 2012-03-03 ENCOUNTER — Encounter (HOSPITAL_COMMUNITY): Payer: Self-pay | Admitting: General Practice

## 2012-03-03 ENCOUNTER — Observation Stay (HOSPITAL_COMMUNITY): Payer: Medicare Other

## 2012-03-03 LAB — TROPONIN I: Troponin I: <0.3 ng/mL (ref <0.30)

## 2012-03-03 LAB — COMPREHENSIVE METABOLIC PANEL
ALT: 28 U/L (ref 0–35)
AST: 29 U/L (ref 0–37)
CO2: 28 mEq/L (ref 19–32)
Calcium: 9 mg/dL (ref 8.4–10.5)
Creatinine, Ser: 1.17 mg/dL — ABNORMAL HIGH (ref 0.50–1.10)
GFR calc non Af Amer: 42 mL/min — ABNORMAL LOW (ref >90)
Sodium: 140 mEq/L (ref 135–145)
Total Protein: 6.3 g/dL (ref 6.0–8.3)

## 2012-03-03 LAB — CBC
HCT: 40.9 % (ref 36.0–46.0)
Hemoglobin: 14.2 g/dL (ref 12.0–15.0)
MCH: 31 pg (ref 26.0–34.0)
MCHC: 34.7 g/dL (ref 30.0–36.0)
RBC: 4.58 MIL/uL (ref 3.87–5.11)

## 2012-03-03 MED ORDER — NITROGLYCERIN 0.4 MG SL SUBL: 0.4000 mg | SUBLINGUAL_TABLET | SUBLINGUAL | Status: DC | PRN

## 2012-03-03 MED ORDER — IOHEXOL 350 MG/ML SOLN: 100.0000 mL | Freq: Once | INTRAVENOUS | Status: DC | PRN

## 2012-03-03 MED ORDER — ASPIRIN 81 MG PO TBEC: 81.0000 mg | DELAYED_RELEASE_TABLET | Freq: Every day | ORAL | Status: DC

## 2012-03-03 MED ORDER — VALSARTAN-HYDROCHLOROTHIAZIDE 320-25 MG PO TABS: 1.0000 | ORAL_TABLET | Freq: Every day | ORAL | Status: AC

## 2012-03-03 NOTE — Discharge Summary (Signed)
Physician Discharge Summary  Patient ID: Marie Cannon MRN: [id number] DOB/AGE: 1926-01-15 78 y.o.  Admit date: 03/02/2012 Discharge date: 03/03/2012   Discharge Diagnoses:  Principal Problem:  *Dyspnea Active Problems:  Gait instability  Insomnia  Anxiety  IBS (irritable bowel syndrome)  Dizzy  SOB (shortness of breath)   Discharged Condition: good  Hospital Course: the patient is an 78 year old Caucasian woman several medical problems who about 2 weeks prior to hospital admission began to have episodes of dyspnea and lightheadedness.  The episodes were described as sudden with moderate dyspnea on exertion and chest heaviness.  On the day of admission she had the symptoms for much of the morning.  She denied recent fever, chills, productive cough, and she has no history of DVT or pulmonary embolism.  She does have the history of bilateral lower extremity arterial insufficiency with day right-sided ABI of 0.47 in May 2013.  In 2003 she had Cardiolite exercise test that showed no ischemia and normal left ventricular systolic function.  She was seen in the office with moderate dyspnea with conversation and anxiety, but no hypoxia.  She was admitted to the hospital for further evaluation.  Her initial laboratory studies included A CBC, complete metabolic panel, EKG, chest x-ray, and cardiac isoenzymes that were normal.  She had serial cardiac isoenzymes drawn that were normal.  She had D-dimer test that was slightly elevated, so she had a CT angiogram of the chest done that showed no evidence of pulmonary embolism or acute cardiopulmonary disease. On the day of discharge she felt fine with no dyspnea or chest discomfort, and her appetite was good with no nausea.  Consults: None  Significant Diagnostic Studies:  Dg Chest 2 View  03/02/2012  *RADIOLOGY REPORT*  Clinical Data: Shortness of breath, weakness  CHEST - 2 VIEW  Comparison: None.  Findings: Cardiomediastinal silhouette is unremarkable.  No  acute infiltrate or pulmonary edema.  Mild basilar atelectasis.  Mild thoracic spine osteopenia.  IMPRESSION:  No acute infiltrate or pulmonary edema.  Mild basilar atelectasis. Mild thoracic spine osteopenia.   Original Report Authenticated By: Natasha Mead, M.D.    Ct Angio Chest Pe W/cm &/or Wo Cm  03/03/2012  *RADIOLOGY REPORT*  Clinical Data: Intermittent lightheadedness/dizziness and shortness of breath  CT ANGIOGRAPHY CHEST  Technique:  Multidetector CT imaging of the chest using the standard protocol during bolus administration of intravenous contrast. Multiplanar reconstructed images including MIPs were obtained and reviewed to evaluate the vascular anatomy.  Contrast:  100 ml Omnipaque-300 IV  Comparison: Chest radiograph dated 03/02/2012  Findings: No evidence of pulmonary embolism.  Mild dependent atelectasis in the left upper lobe and bilateral lower lobes.  No suspicious pulmonary nodules. No pleural effusion or pneumothorax.  The heart is top normal in size.  No pericardial effusion. Atherosclerotic calcifications of the aortic arch.  No suspicious mediastinal, hilar, or axillary lymphadenopathy.  Visualized upper abdomen is notable for cholecystectomy clips, and pneumobilia, left renal atrophy, narrowing at the origin of the celiac artery (series 6/image 98), and eccentric mural thrombus in the descending aorta.  Mild degenerative changes of the visualized thoracolumbar spine.  IMPRESSION: No evidence of pulmonary embolism.  No evidence of acute cardiopulmonary disease.   Original Report Authenticated By: Charline Bills, M.D.     Labs: Lab Results  Component Value Date   WBC 7.4 03/03/2012   HGB 14.2 03/03/2012   HCT 40.9 03/03/2012   MCV 89.3 03/03/2012   PLT 188 03/03/2012     Lab  03/03/12 0650  NA 140  K 3.4*  CL 101  CO2 28  BUN 21  CREATININE 1.17*  CALCIUM 9.0  PROT 6.3  BILITOT 0.4  ALKPHOS 65  ALT 28  AST 29  GLUCOSE 134*       No results found for this basename: INR,  PROTIME     No results found for this or any previous visit (from the past 240 hour(s)).    Discharge Exam: Blood pressure 121/75, pulse 84, temperature 97.7 F (36.5 C), temperature source Oral, resp. rate 18, height 5\' 6"  (1.676 m), weight 85.4 kg (188 lb 4.4 oz), SpO2 96.00%.  Physical Exam: In general, the patient is mildly overweight white woman who was in no apparent distress while lying at 30 elevation of head of bed.  HEENT exam was within normal limits, neck was supple without jugular venous distention or carotid bruit, chest was clear to auscultation, heart had irregular rate and rhythm and was without significant murmur or gallop, abdomen had normal bowel sounds and no hepatosplenomegaly or tenderness, extremities were without cyanosis, clubbing, or edema.  The bilateral pedal pulses were barely palpable.  Neurologic exam was nonfocal.  Disposition:  She'll be discharged to home in the company of family members.  She has had nitroglycerin sublingual added to her medication in case she has recurrent symptoms of sudden dyspnea.  At her follow-up office visit we will arrange for her cardiologist evaluation for possible further testing to exclude coronary artery disease.  Discharge Orders    Future Orders Please Complete By Expires   Diet - low sodium heart healthy      Increase activity slowly      Discharge instructions      Comments:   She'll be discharged home today.  She should call our office tomorrow to set up A follow-up hospitalization visit within 1 week of discharge.  She should call sooner should she have any worsening symptoms such as shortness of breath, chest discomfort, fever, chills or other concerning symptoms.   Call MD for:      Call MD for:  persistant nausea and vomiting      Comments:   Call physician for fever, chills, worsening shortness of breath, significant chest discomfort, or other concerning symptoms.   Call MD for:  persistant dizziness or  light-headedness        Medication List  As of 03/03/2012  6:38 PM   STOP taking these medications         VITAMIN B 12 PO         TAKE these medications         amLODipine 2.5 MG tablet   Commonly known as: NORVASC   Take 2.5 mg by mouth daily.      aspirin 81 MG EC tablet   Take 1 tablet (81 mg total) by mouth daily. Swallow whole.      diazepam 5 MG tablet   Commonly known as: VALIUM   Take 5 mg by mouth at bedtime.      gabapentin 300 MG capsule   Commonly known as: NEURONTIN   Take 900 mg by mouth at bedtime.      levothyroxine 88 MCG tablet   Commonly known as: SYNTHROID, LEVOTHROID   Take 88 mcg by mouth daily.      nitroGLYCERIN 0.4 MG SL tablet   Commonly known as: NITROSTAT   Place 1 tablet (0.4 mg total) under the tongue every 5 (five) minutes as needed for  chest pain (take under the tongue as needed for chest pain or sudden difficulty breathing).      nortriptyline 25 MG capsule   Commonly known as: PAMELOR   Take 25 mg by mouth at bedtime.      valsartan-hydrochlorothiazide 320-25 MG per tablet   Commonly known as: DIOVAN-HCT   Take 1 tablet by mouth daily at 12 noon.           Follow-up Information    Follow up with Garlan Fillers, MD. Schedule an appointment as soon as possible for a visit in 1 week.   Contact information:   [address], [place]. [place] [address] [phone]          Signed: Garlan Fillers 03/03/2012, 6:38 PM

## 2012-03-03 NOTE — Care Management Note (Unsigned)
    Page 1 of 1   03/03/2012     11:47:26 AM   CARE MANAGEMENT NOTE 03/03/2012  Patient:  Marie Cannon, Marie Cannon   Account Number:  [id number]  Date Initiated:  03/03/2012  Documentation initiated by:  SIMMONS,Mishawn Hemann  Subjective/Objective Assessment:   ADMITTED WITH SHOB; LIVES AT HOME WITH HUSBAND;WAS IPTA; USES [facility] FOR RX.     Action/Plan:   DISCHARGE PLANNING DISCUSSED AT BEDSIDE.   Anticipated DC Date:  03/03/2012   Anticipated DC Plan:  HOME/SELF CARE      DC Planning Services  CM consult      Choice offered to / List presented to:             Status of service:  In process, will continue to follow Medicare Important Message given?   (If response is "NO", the following Medicare IM given date fields will be blank) Date Medicare IM given:   Date Additional Medicare IM given:    Discharge Disposition:    Per UR Regulation:  Reviewed for med. necessity/level of care/duration of stay  If discussed at Long Length of Stay Meetings, dates discussed:    Comments:  03/03/12  1146  Briana Farner SIMMONS RN, BSN [phone] NCM WILL FOLLOW.

## 2012-03-03 NOTE — Progress Notes (Signed)
Noted pt to begin hyperventilating after talking to her daughter-in-law on phone.  I was getting ready to assist her to bathroom and she was sitting on side of bed after eating lunch, getting ready to go downstairs for her CT angio chest.  Noted SHOB/hyperventilation.  Pt. Stated ," I'm having one of those spells".  o2 sat 93-95.  Had pt slow breathing down, quickly resolved.  No rhythm changes on monitor.  Will cont to monitor.

## 2012-03-03 NOTE — Progress Notes (Signed)
Subjective: She is feeling much better today, no dyspnea or chest discomfort, appetite is good. Objective: Vital signs in last 24 hours: Temp:  [98 F (36.7 C)-99 F (37.2 C)] 98.6 F (37 C) (09/05 0514) Pulse Rate:  [66-86] 69  (09/05 0514) Resp:  [15-26] 18  (09/05 0514) BP: (111-149)/(56-95) 149/95 mmHg (09/05 0514) SpO2:  [92 %-98 %] 94 % (09/05 0514) Weight:  [85.4 kg (188 lb 4.4 oz)] 85.4 kg (188 lb 4.4 oz) (09/04 1759) Weight change:    Intake/Output from previous day: 09/04 0701 - 09/05 0700 In: 1082.5 [P.O.:480; I.V.:602.5] Out: 150 [Urine:150]   General appearance: alert, cooperative and no distress Resp: clear to auscultation bilaterally Cardio: regular rate and rhythm, S1, S2 normal, no murmur, click, rub or gallop GI: soft, non-tender; bowel sounds normal; no masses,  no organomegaly Extremities: extremities normal, atraumatic, no cyanosis or edema  Lab Results:  Basename 03/03/12 0650 03/02/12 1954  WBC 7.4 8.5  HGB 14.2 14.9  HCT 40.9 42.5  PLT 188 202   BMET  Basename 03/02/12 1954 03/02/12 1420  NA -- 137  K -- 3.3*  CL -- 96  CO2 -- 28  GLUCOSE -- 174*  BUN -- 25*  CREATININE 1.17* 1.29*  CALCIUM -- 10.1   CMET CMP     Component Value Date/Time   NA 137 03/02/2012 1420   K 3.3* 03/02/2012 1420   CL 96 03/02/2012 1420   CO2 28 03/02/2012 1420   GLUCOSE 174* 03/02/2012 1420   BUN 25* 03/02/2012 1420   CREATININE 1.17* 03/02/2012 1954   CALCIUM 10.1 03/02/2012 1420   GFRNONAA 42* 03/02/2012 1954   GFRAA 48* 03/02/2012 1954    CBG (last 3)  No results found for this basename: GLUCAP:3 in the last 72 hours  INR RESULTS:   No results found for this basename: INR, PROTIME     Studies/Results: Dg Chest 2 View  03/02/2012  *RADIOLOGY REPORT*  Clinical Data: Shortness of breath, weakness  CHEST - 2 VIEW  Comparison: None.  Findings: Cardiomediastinal silhouette is unremarkable.  No acute infiltrate or pulmonary edema.  Mild basilar atelectasis.  Mild  thoracic spine osteopenia.  IMPRESSION:  No acute infiltrate or pulmonary edema.  Mild basilar atelectasis. Mild thoracic spine osteopenia.   Original Report Authenticated By: Natasha Mead, M.D.     Medications: I have reviewed the patient's current medications.  Assessment/Plan: #1 Dyspnea: unclear cause, PE is unlikely but d-dimer is elevated so will recheck BMET today after hydration and check CT angio chest today to evaluate for evidence of PE. If negative then will discharge patient to home later today and plan on an outpatient cardiac nuclear imaging study.   LOS: 1 day   Daryan Buell G 03/03/2012, 8:14 AM

## 2012-03-03 NOTE — Evaluation (Signed)
Physical Therapy Evaluation Patient Details Name: Marie Cannon MRN: [id number] DOB: Dec 15, 1925 Today's Date: 03/03/2012 Time: 5784-6962 PT Time Calculation (min): 25 min  PT Assessment / Plan / Recommendation Clinical Impression  pt admitted with episodes of SOB, and lightheadedness that did not correspond to exertional activity per pt.  At time of eval all test are negative.  Pt's mobility is safe and she is at an independent level. No follow up .  D/C from PT    PT Assessment  Patent does not need any further PT services    Follow Up Recommendations  No PT follow up    Barriers to Discharge        Equipment Recommendations  None recommended by PT    Recommendations for Other Services     Frequency      Precautions / Restrictions     Pertinent Vitals/Pain       Mobility  Bed Mobility Bed Mobility: Left Sidelying to Sit;Supine to Sit Left Sidelying to Sit: 7: Independent Supine to Sit: 7: Independent Transfers Transfers: Sit to Stand;Stand to Sit Sit to Stand: 6: Modified independent (Device/Increase time) Stand to Sit: 7: Independent Ambulation/Gait Ambulation/Gait Assistance: 7: Independent Ambulation Distance (Feet): 300 Feet Assistive device: None Ambulation/Gait Assistance Details: gait steady even with cadence changes, negotiating and stepping over objects, starts/stops and abrupt turns Gait Pattern: Within Functional Limits Stairs: Yes Stairs Assistance: 6: Modified independent (Device/Increase time) Stair Management Technique: One rail Right;Alternating pattern;Step to pattern;Forwards;Sideways (up forward, down sideways) Number of Stairs: 4  Wheelchair Mobility Wheelchair Mobility: No    Exercises     PT Diagnosis:    PT Problem List:   PT Treatment Interventions:     PT Goals Acute Rehab PT Goals PT Goal Formulation: With patient  Visit Information  Last PT Received On: 03/03/12 Assistance Needed: +1    Subjective Data  Subjective: No I do  everything for myself Patient Stated Goal: I want to get home   Prior Functioning  Home Living Lives With: Spouse Available Help at Discharge: Family Type of Home: House Home Access: Stairs to enter [employer] of Steps: 3 Entrance Stairs-Rails: Left;Right Home Layout: Two level;Able to live on main level with bedroom/bathroom Alternate Level Stairs-Number of Steps: flight Alternate Level Stairs-Rails: Left;Right Bathroom Shower/Tub: Health visitor: Standard Home Adaptive Equipment: Shower chair with back;Walker - four wheeled;Walker - rolling;Straight cane;Grab bars in shower Prior Function Level of Independence: Independent Able to Take Stairs?: Yes Driving: Yes Comments: I drive [place] down to [place] for the winter. Communication Communication: No difficulties    Cognition  Overall Cognitive Status: Appears within functional limits for tasks assessed/performed Arousal/Alertness: Awake/alert Orientation Level: Appears intact for tasks assessed;Oriented X4 / Intact Behavior During Session: [facility] for tasks performed    Extremity/Trunk Assessment Right Upper Extremity Assessment RUE ROM/Strength/Tone: Within functional levels RUE Sensation: WFL - Light Touch RUE Coordination: WFL - gross/fine motor Left Upper Extremity Assessment LUE ROM/Strength/Tone: Within functional levels LUE Sensation: WFL - Light Touch LUE Coordination: WFL - gross/fine motor Right Lower Extremity Assessment RLE ROM/Strength/Tone: Within functional levels RLE Sensation: WFL - Light Touch RLE Coordination: WFL - gross/fine motor Left Lower Extremity Assessment LLE ROM/Strength/Tone: Within functional levels LLE Sensation: WFL - Light Touch LLE Coordination: WFL - gross/fine motor Trunk Assessment Trunk Assessment: Normal   Balance Balance Balance Assessed:  (steady and safe.) Static Sitting Balance Static Sitting - Level of Assistance: 7: Independent Dynamic Standing  Balance Dynamic Standing - Level of Assistance:  6: Modified independent (Device/Increase time) Dynamic Standing - Balance Activities:  (reaching for shoes under her bed) High Level Balance High Level Balance Activites: Direction changes;Turns;Sudden stops;Head turns  End of Session PT - End of Session Activity Tolerance: Patient tolerated treatment well Patient left: Other (comment);with call bell/phone within reach;with family/visitor present (sitting EOB) Nurse Communication: Mobility status  GP Functional Assessment Tool Used: clinical judgement Functional Limitation: Mobility: Walking and moving around Mobility: Walking and Moving Around Current Status ([id number]): 0 percent impaired, limited or restricted Mobility: Walking and Moving Around Goal Status (X5284): 0 percent impaired, limited or restricted Mobility: Walking and Moving Around Discharge Status [phone]): 0 percent impaired, limited or restricted   Rae Sutcliffe, Eliseo Gum 03/03/2012, 5:30 PM  03/03/2012  [place] Bing, PT [phone] (pager)

## 2012-03-04 NOTE — ED Provider Notes (Signed)
Medical screening examination/treatment/procedure(s) were conducted as a shared visit with non-physician practitioner(s) and myself.  I personally evaluated the patient during the encounter 78 yo woman has been having episodes of shortness of breath that are quite severe, leading to near syncope, and lasting about an hour before abating, at least daily for 3 weeks. She was sent in from Dr. Georgiann Hahn office for evaluation. Physical exam was nonrevealing, as was EKG, chest x-ray, cardiac markers and BNP. Call to [facility] to admit her for observation of near syncope.       Carleene Cooper III, MD 03/04/12 [phone]

## 2012-05-01 ENCOUNTER — Encounter (HOSPITAL_COMMUNITY): Payer: Self-pay

## 2012-05-01 ENCOUNTER — Emergency Department (HOSPITAL_COMMUNITY): Payer: Medicare Other

## 2012-05-01 ENCOUNTER — Emergency Department (HOSPITAL_COMMUNITY)
Admission: EM | Admit: 2012-05-01 | Discharge: 2012-05-01 | Disposition: A | Discharge status: Home / Self Care | Payer: Medicare Other | Attending: Emergency Medicine | Admitting: Emergency Medicine

## 2012-05-01 DIAGNOSIS — M1A00X1 Idiopathic chronic gout, unspecified site, with tophus (tophi): Secondary | ICD-10-CM | POA: Insufficient documentation

## 2012-05-01 DIAGNOSIS — Z8719 Personal history of other diseases of the digestive system: Secondary | ICD-10-CM | POA: Insufficient documentation

## 2012-05-01 DIAGNOSIS — Z79899 Other long term (current) drug therapy: Secondary | ICD-10-CM | POA: Insufficient documentation

## 2012-05-01 DIAGNOSIS — M1A9XX1 Chronic gout, unspecified, with tophus (tophi): Secondary | ICD-10-CM

## 2012-05-01 DIAGNOSIS — F411 Generalized anxiety disorder: Secondary | ICD-10-CM | POA: Insufficient documentation

## 2012-05-01 DIAGNOSIS — Z8669 Personal history of other diseases of the nervous system and sense organs: Secondary | ICD-10-CM | POA: Insufficient documentation

## 2012-05-01 DIAGNOSIS — Z8679 Personal history of other diseases of the circulatory system: Secondary | ICD-10-CM | POA: Insufficient documentation

## 2012-05-01 DIAGNOSIS — Z7982 Long term (current) use of aspirin: Secondary | ICD-10-CM | POA: Insufficient documentation

## 2012-05-01 LAB — CBC
HCT: 43.2 % (ref 36.0–46.0)
Hemoglobin: 15.2 g/dL — ABNORMAL HIGH (ref 12.0–15.0)
MCH: 31.6 pg (ref 26.0–34.0)
MCHC: 35.2 g/dL (ref 30.0–36.0)
MCV: 89.8 fL (ref 78.0–100.0)
Platelets: 201 10*3/uL (ref 150–400)
RBC: 4.81 MIL/uL (ref 3.87–5.11)
RDW: 13 % (ref 11.5–15.5)
WBC: 9.7 10*3/uL (ref 4.0–10.5)

## 2012-05-01 LAB — BASIC METABOLIC PANEL
BUN: 15 mg/dL (ref 6–23)
CO2: 28 mEq/L (ref 19–32)
Calcium: 9.3 mg/dL (ref 8.4–10.5)
Chloride: 103 mEq/L (ref 96–112)
Creatinine, Ser: 1.02 mg/dL (ref 0.50–1.10)
GFR calc Af Amer: 57 mL/min — ABNORMAL LOW (ref >90)
GFR calc non Af Amer: 49 mL/min — ABNORMAL LOW (ref >90)
Glucose, Bld: 150 mg/dL — ABNORMAL HIGH (ref 70–99)
Potassium: 4.3 mEq/L (ref 3.5–5.1)
Sodium: 139 mEq/L (ref 135–145)

## 2012-05-01 MED ORDER — CEPHALEXIN 500 MG PO CAPS: 500.0000 mg | ORAL_CAPSULE | Freq: Four times a day (QID) | ORAL | Status: DC

## 2012-05-01 MED ORDER — COLCHICINE 0.6 MG PO TABS
0.6000 mg | ORAL_TABLET | Freq: Once | ORAL | Status: AC
  Administered 2012-05-01: 0.6 mg via ORAL
  Filled 2012-05-01: qty 1

## 2012-05-01 MED ORDER — VANCOMYCIN HCL IN DEXTROSE 1-5 GM/200ML-% IV SOLN
1000.0000 mg | Freq: Once | INTRAVENOUS | Status: AC
  Administered 2012-05-01: 1000 mg via INTRAVENOUS
  Filled 2012-05-01: qty 200

## 2012-05-01 MED ORDER — OXYCODONE-ACETAMINOPHEN 5-325 MG PO TABS: 1.0000 | ORAL_TABLET | ORAL | Status: DC | PRN

## 2012-05-01 MED ORDER — COLCHICINE 0.6 MG PO TABS: 0.6000 mg | ORAL_TABLET | Freq: Two times a day (BID) | ORAL | Status: DC | PRN

## 2012-05-01 MED ORDER — ONDANSETRON HCL 4 MG/2ML IJ SOLN
4.0000 mg | Freq: Once | INTRAMUSCULAR | Status: AC
  Administered 2012-05-01: 4 mg via INTRAVENOUS
  Filled 2012-05-01: qty 2

## 2012-05-01 MED ORDER — MORPHINE SULFATE 4 MG/ML IJ SOLN
4.0000 mg | Freq: Once | INTRAMUSCULAR | Status: AC
  Administered 2012-05-01: 4 mg via INTRAVENOUS
  Filled 2012-05-01: qty 1

## 2012-05-01 NOTE — ED Notes (Signed)
Pt reports swelling, redness, and pain to (L) pointer finger and middle finger 04/29/2012, pt denies any injury to area

## 2012-05-01 NOTE — ED Provider Notes (Signed)
History    78 year old female with pain in her second and third fingers of her left hand. Onset was about 2 days ago. Denies trauma. Progressively worsening since. Pain is worse with range of motion. No fevers or chills. No history of similar complaints. Denies history of gout. Denies history of diabetes.  CSN: [id number]  Arrival date & time 05/01/12  4098   First MD Initiated Contact with Patient 05/01/12 1959      Chief Complaint  Patient presents with  . Cellulitis    (Consider location/radiation/quality/duration/timing/severity/associated sxs/prior treatment) HPI  Past Medical History  Diagnosis Date  . Gait instability   . Insomnia   . Anxiety   . IBS (irritable bowel syndrome)   . Diverticulosis   . Shortness of breath   . Heart murmur   . Peripheral vascular disease   . Arthritis   . Dyslipidemia   . Peripheral neuropathy     Past Surgical History  Procedure Date  . Cholecystectomy   . Cataract extraction   . Tah and bso   . Abdominal hysterectomy     History reviewed. No pertinent family history.  History  Substance Use Topics  . Smoking status: Never Smoker   . Smokeless tobacco: Never Used  . Alcohol Use: No    OB History    Grav Para Term Preterm Abortions TAB SAB Ect Mult Living                  Review of Systems   Review of symptoms negative unless otherwise noted in HPI.   Allergies  Codeine  Home Medications   Current Outpatient Rx  Name  Route  Sig  Dispense  Refill  . AMLODIPINE BESYLATE 2.5 MG PO TABS   Oral   Take 2.5 mg by mouth daily.         . ASPIRIN 81 MG PO TBEC   Oral   Take 1 tablet (81 mg total) by mouth daily. Swallow whole.   30 tablet   12   . DIAZEPAM 5 MG PO TABS   Oral   Take 5 mg by mouth at bedtime.         Marland Kitchen GABAPENTIN 300 MG PO CAPS   Oral   Take 900 mg by mouth at bedtime.         Marland Kitchen LEVOTHYROXINE SODIUM 88 MCG PO TABS   Oral   Take 88 mcg by mouth daily.         Marland Kitchen NITROGLYCERIN  0.4 MG SL SUBL   Sublingual   Place 1 tablet (0.4 mg total) under the tongue every 5 (five) minutes as needed for chest pain (take under the tongue as needed for chest pain or sudden difficulty breathing).   25 tablet   12   . NORTRIPTYLINE HCL 25 MG PO CAPS   Oral   Take 25 mg by mouth at bedtime.         Marland Kitchen VALSARTAN-HYDROCHLOROTHIAZIDE 320-25 MG PO TABS   Oral   Take 1 tablet by mouth daily at 12 noon.   30 tablet   12     BP 158/104  Pulse 72  Temp 98.6 F (37 C) (Oral)  Resp 20  SpO2 96%  Physical Exam  Nursing note and vitals reviewed. Constitutional: She appears well-developed and well-nourished. No distress.  HENT:  Head: Normocephalic and atraumatic.  Eyes: Conjunctivae normal are normal. Right eye exhibits no discharge. Left eye exhibits no discharge.  Neck: Neck supple.  Cardiovascular: Normal rate, regular rhythm and normal heart sounds.  Exam reveals no gallop and no friction rub.   No murmur heard. Pulmonary/Chest: Effort normal and breath sounds normal. No respiratory distress.  Abdominal: Soft. She exhibits no distension. There is no tenderness.  Musculoskeletal: She exhibits no edema and no tenderness.       Diffuse swelling and erythema to left index and middle fingers. Small tophi noted to the palmar aspect of the fingers. Exquisite pain with range of motion.  Neurological: She is alert.  Skin: Skin is warm and dry.  Psychiatric: She has a normal mood and affect. Her behavior is normal. Thought content normal.    ED Course  Procedures (including critical care time)  Labs Reviewed  CBC - Abnormal; Notable for the following:    Hemoglobin 15.2 (*)     All other components within normal limits  BASIC METABOLIC PANEL - Abnormal; Notable for the following:    Glucose, Bld 150 (*)     GFR calc non Af Amer 49 (*)     GFR calc Af Amer 57 (*)     All other components within normal limits   Dg Finger Index Left  05/01/2012  *RADIOLOGY REPORT*   Clinical Data: Cellulitis  LEFT INDEX FINGER 2+V  Comparison: None.  Findings: Frontal, oblique, and lateral views were obtained.  There is diffuse swelling.  There is no fracture or dislocation.  There is osteoarthritic change in the second MCP, PIP, and DIP joints, most pronounced in the DIP joint.  There is no erosive change or bony destruction.  IMPRESSION:  Extensive osteoarthritis.  Diffuse soft tissue swelling.  No fracture.  No appreciable bony destruction.   Original Report Authenticated By: Bretta Bang, M.D.      1. Tophaceous gout       MDM  78 year old female with atraumatic finger pain. Erythema and tenderness along the distal aspect of her finger. I suspect this is secondary to gout. She has multiple tophi along palmar surface of finger. Consider infectious, but I think this is much less likely. Will cover ago for possible infectious etiology though until can followup with hand surgery. I do not think that she needs emergent surgical evaluation. PRN percocet and also cholchicine. Outpatient followup with hand.        Raeford Razor, MD 05/11/12 [phone]

## 2012-05-01 NOTE — ED Notes (Addendum)
Slight flushing of face noted possible d/t antibiotic vancomycin but pt denies itching or other symptoms. Denies SOB lungs clear respiration 18 and easy and pt A&O x3 family members at the bedside vancomycin infusion complete at this time.

## 2012-12-26 ENCOUNTER — Emergency Department (HOSPITAL_COMMUNITY)
Admission: EM | Admit: 2012-12-26 | Discharge: 2012-12-27 | Disposition: A | Discharge status: Home / Self Care | Payer: No Typology Code available for payment source | Attending: Emergency Medicine | Admitting: Emergency Medicine

## 2012-12-26 ENCOUNTER — Emergency Department (INDEPENDENT_AMBULATORY_CARE_PROVIDER_SITE_OTHER): Admission: EM | Admit: 2012-12-26 | Discharge: 2012-12-26 | Payer: Medicare Other | Source: Home / Self Care

## 2012-12-26 ENCOUNTER — Emergency Department (HOSPITAL_COMMUNITY): Payer: No Typology Code available for payment source

## 2012-12-26 ENCOUNTER — Encounter (HOSPITAL_COMMUNITY): Payer: Self-pay | Admitting: Emergency Medicine

## 2012-12-26 DIAGNOSIS — Z8719 Personal history of other diseases of the digestive system: Secondary | ICD-10-CM | POA: Insufficient documentation

## 2012-12-26 DIAGNOSIS — R220 Localized swelling, mass and lump, head: Secondary | ICD-10-CM

## 2012-12-26 DIAGNOSIS — I739 Peripheral vascular disease, unspecified: Secondary | ICD-10-CM | POA: Insufficient documentation

## 2012-12-26 DIAGNOSIS — Y9389 Activity, other specified: Secondary | ICD-10-CM | POA: Insufficient documentation

## 2012-12-26 DIAGNOSIS — S0083XA Contusion of other part of head, initial encounter: Secondary | ICD-10-CM

## 2012-12-26 DIAGNOSIS — F411 Generalized anxiety disorder: Secondary | ICD-10-CM | POA: Insufficient documentation

## 2012-12-26 DIAGNOSIS — G609 Hereditary and idiopathic neuropathy, unspecified: Secondary | ICD-10-CM | POA: Insufficient documentation

## 2012-12-26 DIAGNOSIS — Y9229 Other specified public building as the place of occurrence of the external cause: Secondary | ICD-10-CM | POA: Insufficient documentation

## 2012-12-26 DIAGNOSIS — E785 Hyperlipidemia, unspecified: Secondary | ICD-10-CM | POA: Insufficient documentation

## 2012-12-26 DIAGNOSIS — Z79899 Other long term (current) drug therapy: Secondary | ICD-10-CM | POA: Insufficient documentation

## 2012-12-26 DIAGNOSIS — Z8679 Personal history of other diseases of the circulatory system: Secondary | ICD-10-CM | POA: Insufficient documentation

## 2012-12-26 DIAGNOSIS — R269 Unspecified abnormalities of gait and mobility: Secondary | ICD-10-CM | POA: Insufficient documentation

## 2012-12-26 DIAGNOSIS — M1990 Unspecified osteoarthritis, unspecified site: Secondary | ICD-10-CM | POA: Insufficient documentation

## 2012-12-26 DIAGNOSIS — S0003XA Contusion of scalp, initial encounter: Secondary | ICD-10-CM | POA: Insufficient documentation

## 2012-12-26 NOTE — ED Notes (Signed)
Pt bp 202/135. Pt states she did not take bp medication today.

## 2012-12-26 NOTE — ED Notes (Signed)
Chart review.
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
## 2012-12-26 NOTE — ED Provider Notes (Signed)
Medical screening examination/treatment/procedure(s) were performed by non-physician practitioner and as supervising physician I was immediately available for consultation/collaboration.  Yovan Leeman, M.D.  Roni Friberg C Brennin Durfee, MD 12/26/12 2153 

## 2012-12-27 ENCOUNTER — Emergency Department (HOSPITAL_COMMUNITY): Payer: No Typology Code available for payment source

## 2012-12-27 ENCOUNTER — Encounter (HOSPITAL_COMMUNITY): Payer: Self-pay | Admitting: Radiology
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
## 2012-12-27 NOTE — ED Notes (Signed)
Pt ambulatory to restroom with family member. Gait is unsteady pt needed one assist with ambulation

## 2013-06-19 ENCOUNTER — Inpatient Hospital Stay (HOSPITAL_COMMUNITY): Payer: Medicare Other

## 2013-06-19 DIAGNOSIS — F419 Anxiety disorder, unspecified: Secondary | ICD-10-CM | POA: Diagnosis present

## 2013-06-19 DIAGNOSIS — E7800 Pure hypercholesterolemia, unspecified: Secondary | ICD-10-CM | POA: Diagnosis present

## 2013-06-19 DIAGNOSIS — I129 Hypertensive chronic kidney disease with stage 1 through stage 4 chronic kidney disease, or unspecified chronic kidney disease: Secondary | ICD-10-CM | POA: Diagnosis present

## 2013-06-19 DIAGNOSIS — I219 Acute myocardial infarction, unspecified: Secondary | ICD-10-CM | POA: Diagnosis present

## 2013-06-19 DIAGNOSIS — M109 Gout, unspecified: Secondary | ICD-10-CM | POA: Diagnosis present

## 2013-06-19 DIAGNOSIS — I959 Hypotension, unspecified: Secondary | ICD-10-CM | POA: Diagnosis present

## 2013-06-19 DIAGNOSIS — R0600 Dyspnea, unspecified: Secondary | ICD-10-CM | POA: Diagnosis present

## 2013-06-19 DIAGNOSIS — I252 Old myocardial infarction: Secondary | ICD-10-CM

## 2013-06-19 DIAGNOSIS — I059 Rheumatic mitral valve disease, unspecified: Secondary | ICD-10-CM | POA: Diagnosis present

## 2013-06-19 DIAGNOSIS — E119 Type 2 diabetes mellitus without complications: Secondary | ICD-10-CM | POA: Diagnosis present

## 2013-06-19 DIAGNOSIS — Z515 Encounter for palliative care: Secondary | ICD-10-CM

## 2013-06-19 DIAGNOSIS — I5021 Acute systolic (congestive) heart failure: Principal | ICD-10-CM | POA: Diagnosis present

## 2013-06-19 DIAGNOSIS — G609 Hereditary and idiopathic neuropathy, unspecified: Secondary | ICD-10-CM | POA: Diagnosis present

## 2013-06-19 DIAGNOSIS — M1990 Unspecified osteoarthritis, unspecified site: Secondary | ICD-10-CM | POA: Diagnosis present

## 2013-06-19 DIAGNOSIS — E1149 Type 2 diabetes mellitus with other diabetic neurological complication: Secondary | ICD-10-CM

## 2013-06-19 DIAGNOSIS — E039 Hypothyroidism, unspecified: Secondary | ICD-10-CM | POA: Diagnosis present

## 2013-06-19 DIAGNOSIS — Z66 Do not resuscitate: Secondary | ICD-10-CM | POA: Diagnosis present

## 2013-06-19 DIAGNOSIS — R0609 Other forms of dyspnea: Secondary | ICD-10-CM | POA: Diagnosis present

## 2013-06-19 DIAGNOSIS — R269 Unspecified abnormalities of gait and mobility: Secondary | ICD-10-CM | POA: Diagnosis present

## 2013-06-19 DIAGNOSIS — R627 Adult failure to thrive: Secondary | ICD-10-CM | POA: Diagnosis present

## 2013-06-19 DIAGNOSIS — N179 Acute kidney failure, unspecified: Secondary | ICD-10-CM | POA: Diagnosis present

## 2013-06-19 DIAGNOSIS — R0602 Shortness of breath: Secondary | ICD-10-CM

## 2013-06-19 DIAGNOSIS — I2589 Other forms of chronic ischemic heart disease: Secondary | ICD-10-CM | POA: Diagnosis present

## 2013-06-19 DIAGNOSIS — I359 Nonrheumatic aortic valve disorder, unspecified: Secondary | ICD-10-CM | POA: Diagnosis present

## 2013-06-19 DIAGNOSIS — K761 Chronic passive congestion of liver: Secondary | ICD-10-CM | POA: Diagnosis present

## 2013-06-19 DIAGNOSIS — I2789 Other specified pulmonary heart diseases: Secondary | ICD-10-CM | POA: Diagnosis present

## 2013-06-19 DIAGNOSIS — R0989 Other specified symptoms and signs involving the circulatory and respiratory systems: Secondary | ICD-10-CM | POA: Diagnosis present

## 2013-06-19 DIAGNOSIS — F411 Generalized anxiety disorder: Secondary | ICD-10-CM | POA: Diagnosis present

## 2013-06-19 DIAGNOSIS — R52 Pain, unspecified: Secondary | ICD-10-CM

## 2013-06-19 DIAGNOSIS — I509 Heart failure, unspecified: Secondary | ICD-10-CM | POA: Diagnosis present

## 2013-06-19 DIAGNOSIS — I2510 Atherosclerotic heart disease of native coronary artery without angina pectoris: Secondary | ICD-10-CM

## 2013-06-19 DIAGNOSIS — Z8719 Personal history of other diseases of the digestive system: Secondary | ICD-10-CM

## 2013-06-19 DIAGNOSIS — R2681 Unsteadiness on feet: Secondary | ICD-10-CM | POA: Diagnosis present

## 2013-06-19 DIAGNOSIS — I739 Peripheral vascular disease, unspecified: Secondary | ICD-10-CM | POA: Diagnosis present

## 2013-06-19 DIAGNOSIS — E785 Hyperlipidemia, unspecified: Secondary | ICD-10-CM | POA: Diagnosis present

## 2013-06-19 DIAGNOSIS — N184 Chronic kidney disease, stage 4 (severe): Secondary | ICD-10-CM | POA: Diagnosis present

## 2013-06-19 DIAGNOSIS — J90 Pleural effusion, not elsewhere classified: Secondary | ICD-10-CM | POA: Diagnosis present

## 2013-06-19 DIAGNOSIS — Z79899 Other long term (current) drug therapy: Secondary | ICD-10-CM

## 2013-06-19 LAB — CBC
Hemoglobin: 12.4 g/dL (ref 12.0–15.0)
MCH: 30.2 pg (ref 26.0–34.0)
MCHC: 32.2 g/dL (ref 30.0–36.0)
MCV: 93.7 fL (ref 78.0–100.0)
RBC: 4.11 MIL/uL (ref 3.87–5.11)
RDW: 14.4 % (ref 11.5–15.5)

## 2013-06-19 LAB — MRSA PCR SCREENING: MRSA by PCR: NEGATIVE

## 2013-06-19 LAB — COMPREHENSIVE METABOLIC PANEL
ALT: 46 U/L — ABNORMAL HIGH (ref 0–35)
Albumin: 3.1 g/dL — ABNORMAL LOW (ref 3.5–5.2)
Alkaline Phosphatase: 118 U/L — ABNORMAL HIGH (ref 39–117)
BUN: 57 mg/dL — ABNORMAL HIGH (ref 6–23)
CO2: 24 mEq/L (ref 19–32)
Calcium: 9 mg/dL (ref 8.4–10.5)
Chloride: 101 mEq/L (ref 96–112)
Creatinine, Ser: 1.82 mg/dL — ABNORMAL HIGH (ref 0.50–1.10)
GFR calc Af Amer: 28 mL/min — ABNORMAL LOW (ref >90)
GFR calc non Af Amer: 24 mL/min — ABNORMAL LOW (ref >90)
Glucose, Bld: 178 mg/dL — ABNORMAL HIGH (ref 70–99)
Potassium: 4.8 mEq/L (ref 3.5–5.1)
Sodium: 137 mEq/L (ref 135–145)
Total Protein: 6.7 g/dL (ref 6.0–8.3)

## 2013-06-19 LAB — URINALYSIS, ROUTINE W REFLEX MICROSCOPIC
Nitrite: NEGATIVE
Specific Gravity, Urine: 1.027 (ref 1.005–1.030)
Urobilinogen, UA: 1 mg/dL (ref 0.0–1.0)
pH: 5.5 (ref 5.0–8.0)

## 2013-06-19 LAB — URINE MICROSCOPIC-ADD ON

## 2013-06-19 LAB — TROPONIN I: Troponin I: 1.68 ng/mL (ref <0.30)

## 2013-06-19 LAB — PRO B NATRIURETIC PEPTIDE: Pro B Natriuretic peptide (BNP): 26142 pg/mL — ABNORMAL HIGH (ref 0–450)

## 2013-06-19 MED ORDER — ALUM & MAG HYDROXIDE-SIMETH 200-200-20 MG/5ML PO SUSP
30.0000 mL | Freq: Four times a day (QID) | ORAL | Status: DC | PRN
  Administered 2013-06-22: 30 mL via ORAL
  Filled 2013-06-19: qty 30

## 2013-06-19 MED ORDER — ENOXAPARIN SODIUM 40 MG/0.4ML ~~LOC~~ SOLN
40.0000 mg | SUBCUTANEOUS | Status: DC
  Filled 2013-06-19: qty 0.4

## 2013-06-19 MED ORDER — ENOXAPARIN SODIUM 30 MG/0.3ML ~~LOC~~ SOLN
30.0000 mg | SUBCUTANEOUS | Status: DC
  Administered 2013-06-19 – 2013-06-20 (×2): 30 mg via SUBCUTANEOUS
  Filled 2013-06-19 (×3): qty 0.3

## 2013-06-19 MED ORDER — ASPIRIN EC 81 MG PO TBEC
81.0000 mg | DELAYED_RELEASE_TABLET | Freq: Every day | ORAL | Status: DC
  Administered 2013-06-20 – 2013-06-26 (×6): 81 mg via ORAL
  Filled 2013-06-19 (×8): qty 1

## 2013-06-19 MED ORDER — FUROSEMIDE 10 MG/ML IJ SOLN
40.0000 mg | Freq: Once | INTRAMUSCULAR | Status: AC
  Administered 2013-06-19: 40 mg via INTRAVENOUS
  Filled 2013-06-19: qty 4

## 2013-06-19 MED ORDER — NITROGLYCERIN 0.1 MG/HR TD PT24
0.1000 mg | MEDICATED_PATCH | Freq: Every day | TRANSDERMAL | Status: DC
  Administered 2013-06-19 – 2013-06-29 (×11): 0.1 mg via TRANSDERMAL
  Filled 2013-06-19 (×12): qty 1

## 2013-06-19 MED ORDER — POTASSIUM CHLORIDE IN NACL 20-0.9 MEQ/L-% IV SOLN
INTRAVENOUS | Status: DC
  Administered 2013-06-19: 18:00:00 via INTRAVENOUS
  Administered 2013-06-23: 20 mL/h via INTRAVENOUS
  Filled 2013-06-19 (×5): qty 1000

## 2013-06-19 MED ORDER — LEVOTHYROXINE SODIUM 88 MCG PO TABS: 88.0000 ug | ORAL_TABLET | Freq: Every day | ORAL | Status: DC

## 2013-06-19 MED ORDER — OXYCODONE HCL 5 MG PO TABS: 5.0000 mg | ORAL_TABLET | ORAL | Status: DC | PRN

## 2013-06-19 MED ORDER — ALLOPURINOL 100 MG PO TABS
100.0000 mg | ORAL_TABLET | Freq: Every day | ORAL | Status: DC
  Administered 2013-06-20 – 2013-06-26 (×6): 100 mg via ORAL
  Filled 2013-06-19 (×7): qty 1

## 2013-06-19 MED ORDER — LEVOTHYROXINE SODIUM 88 MCG PO TABS
88.0000 ug | ORAL_TABLET | Freq: Every day | ORAL | Status: DC
  Administered 2013-06-20 – 2013-06-28 (×8): 88 ug via ORAL
  Filled 2013-06-19 (×13): qty 1

## 2013-06-19 MED ORDER — ONDANSETRON HCL 4 MG/2ML IJ SOLN: 4.0000 mg | Freq: Four times a day (QID) | INTRAMUSCULAR | Status: DC | PRN

## 2013-06-19 MED ORDER — METHYLPREDNISOLONE SODIUM SUCC 125 MG IJ SOLR
80.0000 mg | Freq: Once | INTRAMUSCULAR | Status: AC
  Administered 2013-06-19: 80 mg via INTRAVENOUS
  Filled 2013-06-19: qty 1.28

## 2013-06-19 MED ORDER — GABAPENTIN 300 MG PO CAPS
900.0000 mg | ORAL_CAPSULE | Freq: Every day | ORAL | Status: DC
  Filled 2013-06-19: qty 3

## 2013-06-19 MED ORDER — NORTRIPTYLINE HCL 25 MG PO CAPS
25.0000 mg | ORAL_CAPSULE | Freq: Every day | ORAL | Status: DC
  Administered 2013-06-19 – 2013-06-25 (×7): 25 mg via ORAL
  Filled 2013-06-19 (×9): qty 1

## 2013-06-19 MED ORDER — GABAPENTIN 300 MG PO CAPS
600.0000 mg | ORAL_CAPSULE | Freq: Every day | ORAL | Status: DC
  Administered 2013-06-19 – 2013-06-20 (×2): 600 mg via ORAL
  Filled 2013-06-19 (×3): qty 2

## 2013-06-19 MED ORDER — ONDANSETRON HCL 4 MG PO TABS: 4.0000 mg | ORAL_TABLET | Freq: Four times a day (QID) | ORAL | Status: DC | PRN

## 2013-06-19 MED ORDER — ZOLPIDEM TARTRATE 5 MG PO TABS: 5.0000 mg | ORAL_TABLET | Freq: Every evening | ORAL | Status: DC | PRN

## 2013-06-19 MED ORDER — ACETAMINOPHEN 650 MG RE SUPP: 650.0000 mg | Freq: Four times a day (QID) | RECTAL | Status: DC | PRN

## 2013-06-19 MED ORDER — ACETAMINOPHEN 325 MG PO TABS: 650.0000 mg | ORAL_TABLET | Freq: Four times a day (QID) | ORAL | Status: DC | PRN

## 2013-06-19 MED ORDER — DIAZEPAM 5 MG PO TABS
2.5000 mg | ORAL_TABLET | Freq: Every day | ORAL | Status: DC
  Administered 2013-06-19 – 2013-06-25 (×7): 2.5 mg via ORAL
  Filled 2013-06-19 (×7): qty 1

## 2013-06-19 MED ORDER — FUROSEMIDE 8 MG/ML PO SOLN
40.0000 mg | Freq: Every day | ORAL | Status: DC
Start: 2013-06-20 — End: 2013-06-20
  Administered 2013-06-20: 40 mg via ORAL
  Filled 2013-06-19: qty 5

## 2013-06-19 NOTE — H&P (Addendum)
Marie Cannon is an 79 y.o. female.   Chief Complaint:  I feel weak and short of breath HPI:  The patient is an 79 year old woman who has several medical problems who presented to our office with a complaint of gradually worsening dyspnea over the past week.  With this she said weakness and difficulty standing up, rhinitis, dry cough, sore throat, and bilateral leg edema.  She has a history of atypical chest discomfort and at 2003 had Cardiolite exercise test that showed no ischemia with an echocardiogram that showed mild atrophy regurgitation and mild aortic insufficiency. Recently she's had the above symptoms without fever, chills, or productive cough or chest discomfort.  She presented to our office looking pale, weak, unable to stand, and with dyspnea at rest, so she was evaluated and transferred to the hospital for direct admission.  She has no known history of coronary artery disease or DVT or pulmonary embolism.  She has had problems with intermittent dyspnea on exertion that led to a workup in September of 2013 that included:  CT angiogram of the chest that showed no PE or primary lung disease, 9/13 Echocardiogram that showed normal LV systolic function with mild LVH and normal wall motion, LV Efx 55-60% with mild TR and mild TR and mild pulmonary HTN (PAP 30.7), 9/13 Lexiscan with normal wall motion and EFx 65%, EKG with NSR with inferior MI, old anterior infarct, IRBBB, low voltage.   Past Medical History  Diagnosis Date  . Gait instability   . Insomnia   . Anxiety   . IBS (irritable bowel syndrome)   . Diverticulosis   . Shortness of breath   . Heart murmur   . Peripheral vascular disease   . Arthritis   . Dyslipidemia   . Peripheral neuropathy     Medications Prior to Admission  Medication Sig Dispense Refill  . ALLOPURINOL PO Take 1 tablet by mouth daily.      . diazepam (VALIUM) 5 MG tablet Take 2.5 mg by mouth at bedtime.       . gabapentin (NEURONTIN) 300 MG capsule Take 900 mg  by mouth at bedtime.      Marland Kitchen levothyroxine (SYNTHROID, LEVOTHROID) 88 MCG tablet Take 88 mcg by mouth daily.      . nortriptyline (PAMELOR) 25 MG capsule Take 25 mg by mouth at bedtime.      . valsartan-hydrochlorothiazide (DIOVAN-HCT) 320-25 MG per tablet Take 1 tablet by mouth daily at 12 noon.  30 tablet  12    ADDITIONAL HOME MEDICATIONS: No additional medications  PHYSICIANS INVOLVED IN CARE:   Yates Decamp (card), Charna Elizabeth (GI), Claudette Head (oph)  PERTINENT PAST OFFICE LABS:  April 16, 2010 BUN 29 with creatinine 1.1, May 01, 2011 BUN 19, creatinine 1.1, June 10, 2012 BUN 23, creatinine 1.1  Admission EKG findings:  Normal sinus rhythm, low-voltage QRS, inferior wall infarction, age undetermined, anterolateral wall infarction, age undetermined, interval loss of R waves in leads V2-V6 and ST segment elevation in leads V2-V5 versus EKG from March 03, 2012  Past Surgical History  Procedure Laterality Date  . Cholecystectomy    . Cataract extraction    . Tah and bso    . Abdominal hysterectomy      No family history on file.   Social History:  reports that she has never smoked. She has never used smokeless tobacco. She reports that she does not drink alcohol or use illicit drugs.  Allergies:  Allergies  Allergen Reactions  .  Codeine Nausea And Vomiting     ROS: ankle swelling, arthritis, heart murmur and kidney disease  PHYSICAL EXAM: Blood pressure 100/66, pulse 75, temperature 98.7 F (37.1 C), temperature source Oral, resp. rate 21, height 5' 6.5" (1.689 m), weight 88.7 kg (195 lb 8.8 oz), SpO2 99.00%. In general, she is a mildly overweight white woman who had dyspnea with conversation while sitting upright on oxygen.  She has very shallow respirations.  HEENT exam was within normal limits, neck was supple without jugular venous distention or carotid bruit, chest had minimal bibasilar crackles and no wheezing or use of accessory muscles of respiration, heart had  a regular rate and rhythm without significant murmur or gallop, abdomen had normal bowel sounds and no hepatosplenomegaly or tenderness, she had bilateral cool legs and feet with nonpalpable pedal pulses and bilateral 1+ leg edema.  Neurologic exam was nonfocal.  Results for orders placed during the hospital encounter of 06/04/2013 (from the past 48 hour(s))  MRSA PCR SCREENING     Status: None   Collection Time    06/07/2013  3:05 PM      Result Value Range   MRSA by PCR NEGATIVE  NEGATIVE   Comment:            The GeneXpert MRSA Assay (FDA     approved for NASAL specimens     only), is one component of a     comprehensive MRSA colonization     surveillance program. It is not     intended to diagnose MRSA     infection nor to guide or     monitor treatment for     MRSA infections.  CBC     Status: Abnormal   Collection Time    06/25/2013  4:33 PM      Result Value Range   WBC 12.4 (*) 4.0 - 10.5 K/uL   RBC 4.11  3.87 - 5.11 MIL/uL   Hemoglobin 12.4  12.0 - 15.0 g/dL   HCT 52.8  41.3 - 24.4 %   MCV 93.7  78.0 - 100.0 fL   MCH 30.2  26.0 - 34.0 pg   MCHC 32.2  30.0 - 36.0 g/dL   RDW 01.0  27.2 - 53.6 %   Platelets 227  150 - 400 K/uL  COMPREHENSIVE METABOLIC PANEL     Status: Abnormal   Collection Time    06/27/2013  4:33 PM      Result Value Range   Sodium 137  135 - 145 mEq/L   Potassium 4.8  3.5 - 5.1 mEq/L   Chloride 101  96 - 112 mEq/L   CO2 24  19 - 32 mEq/L   Glucose, Bld 178 (*) 70 - 99 mg/dL   BUN 57 (*) 6 - 23 mg/dL   Creatinine, Ser 6.44 (*) 0.50 - 1.10 mg/dL   Calcium 9.0  8.4 - 03.4 mg/dL   Total Protein 6.7  6.0 - 8.3 g/dL   Albumin 3.1 (*) 3.5 - 5.2 g/dL   AST 40 (*) 0 - 37 U/L   ALT 46 (*) 0 - 35 U/L   Alkaline Phosphatase 118 (*) 39 - 117 U/L   Total Bilirubin 0.8  0.3 - 1.2 mg/dL   GFR calc non Af Amer 24 (*) >90 mL/min   GFR calc Af Amer 28 (*) >90 mL/min   Comment: (NOTE)     The eGFR has been calculated using the CKD EPI equation.     This calculation  has not been validated in all clinical situations.     eGFR's persistently <90 mL/min signify possible Chronic Kidney     Disease.  PRO B NATRIURETIC PEPTIDE     Status: Abnormal   Collection Time    07/17/2013  4:33 PM      Result Value Range   Pro B Natriuretic peptide (BNP) 26142.0 (*) 0 - 450 pg/mL   X-ray Chest Pa And Lateral   July 17, 2013   CLINICAL DATA:  Shortness of breath, cough  EXAM: CHEST  2 VIEW  COMPARISON:  03/22/2012  FINDINGS: Cardiac silhouette is enlarged. There is increased density project within the lung bases left greater than right. Is blunting of left costophrenic angle. There is prominence of the interstitial markings and areas of peribronchial cuffing. The osseous structures unremarkable.  IMPRESSION: Atelectasis versus infiltrate within the lung bases versus asymmetric edema. Interstitial infiltrate likely representing pulmonary edema. Small effusion on the left is also diagnostic consideration.   Electronically Signed   By: Salome Holmes M.D.   On: July 17, 2013 17:09     Assessment/Plan #1 Dyspnea:  Moderately severe and likely from acute congestive heart failure.  In the past she's had normal left ventricular systolic function.  She may have diastolic dysfunction from hypertensive heart disease versus coronary artery disease.  It is unlikely that her dyspnea is from severe anemia, pulmonary embolism, or a sepsis syndrome.  We will check serial cardiac isoenzymes to rule out the unlikely possibility of acute myocardial infarction.  We will also initiate treatment with Lasix IV and low dose nitrates.  The likelihood of pulmonary embolism is low and we will check a d-dimer test.  A CT scan of the chest could cause acute renal failure given her elevated serum creatinine level. #2 Acute on Chronic Renal Insufficiency: Interval increase in serum creatinine level likely from  Intravascular volume depletion and associated hypotension.  We will try to normalize her blood pressures  and follow her renal function closely.  Kyrin Garn G 07/17/13, 6:46 PM

## 2013-06-19 NOTE — Progress Notes (Signed)
CRITICAL VALUE ALERT  Critical value received:  Troponin = 1.68  Date of notification:  07-12-2013  Time of notification:  20:53  Critical value read back:yes  Nurse who received alert:  Guinevere Scarlet, RN  MD notified (1st page):  Dr. Timothy Lasso, On call [facility]   Time of first page:  20:53  MD notified (2nd page):  Time of second page:  Responding MD:  Dr. Timothy Lasso  Time MD responded:  20:55

## 2013-06-20 ENCOUNTER — Encounter (HOSPITAL_COMMUNITY): Payer: Self-pay | Admitting: *Deleted

## 2013-06-20 LAB — BASIC METABOLIC PANEL
Chloride: 100 mEq/L (ref 96–112)
GFR calc Af Amer: 26 mL/min — ABNORMAL LOW (ref >90)
GFR calc non Af Amer: 22 mL/min — ABNORMAL LOW (ref >90)
Potassium: 5 mEq/L (ref 3.5–5.1)
Sodium: 135 mEq/L (ref 135–145)

## 2013-06-20 LAB — CBC
HCT: 36.8 % (ref 36.0–46.0)
Hemoglobin: 12.2 g/dL (ref 12.0–15.0)
MCHC: 33.2 g/dL (ref 30.0–36.0)
Platelets: 242 10*3/uL (ref 150–400)
RBC: 3.92 MIL/uL (ref 3.87–5.11)
WBC: 11.9 10*3/uL — ABNORMAL HIGH (ref 4.0–10.5)

## 2013-06-20 LAB — GLUCOSE, CAPILLARY
Glucose-Capillary: 276 mg/dL — ABNORMAL HIGH (ref 70–99)
Glucose-Capillary: 192 mg/dL — ABNORMAL HIGH (ref 70–99)

## 2013-06-20 LAB — HEMOGLOBIN A1C: Mean Plasma Glucose: 180 mg/dL — ABNORMAL HIGH (ref <117)

## 2013-06-20 LAB — TROPONIN I
Troponin I: 1.68 ng/mL (ref <0.30)
Troponin I: 1.07 ng/mL (ref <0.30)

## 2013-06-20 LAB — URIC ACID: Uric Acid, Serum: 7.6 mg/dL — ABNORMAL HIGH (ref 2.4–7.0)

## 2013-06-20 MED ORDER — ATORVASTATIN CALCIUM 80 MG PO TABS
80.0000 mg | ORAL_TABLET | Freq: Every day | ORAL | Status: DC
  Administered 2013-06-20 – 2013-06-25 (×6): 80 mg via ORAL
  Filled 2013-06-20 (×7): qty 1

## 2013-06-20 MED ORDER — PANTOPRAZOLE SODIUM 40 MG PO TBEC
40.0000 mg | DELAYED_RELEASE_TABLET | Freq: Every day | ORAL | Status: DC
  Administered 2013-06-20 – 2013-06-26 (×6): 40 mg via ORAL
  Filled 2013-06-20 (×7): qty 1

## 2013-06-20 MED ORDER — CLOPIDOGREL BISULFATE 300 MG PO TABS
300.0000 mg | ORAL_TABLET | Freq: Once | ORAL | Status: DC
  Filled 2013-06-20: qty 1

## 2013-06-20 MED ORDER — INSULIN ASPART 100 UNIT/ML ~~LOC~~ SOLN
3.0000 [IU] | Freq: Three times a day (TID) | SUBCUTANEOUS | Status: DC
  Administered 2013-06-23 (×2): 3 [IU] via SUBCUTANEOUS

## 2013-06-20 MED ORDER — INSULIN ASPART 100 UNIT/ML ~~LOC~~ SOLN
0.0000 [IU] | Freq: Three times a day (TID) | SUBCUTANEOUS | Status: DC
  Administered 2013-06-20: 8 [IU] via SUBCUTANEOUS
  Administered 2013-06-21 – 2013-06-22 (×4): 2 [IU] via SUBCUTANEOUS
  Administered 2013-06-23: 3 [IU] via SUBCUTANEOUS
  Administered 2013-06-23 (×2): 2 [IU] via SUBCUTANEOUS

## 2013-06-20 MED ORDER — INSULIN ASPART 100 UNIT/ML ~~LOC~~ SOLN
0.0000 [IU] | Freq: Every day | SUBCUTANEOUS | Status: DC
  Administered 2013-06-23: 3 [IU] via SUBCUTANEOUS

## 2013-06-20 NOTE — Consult Note (Signed)
Reason for Consult:AKI/CKD Referring Physician: Dr. Wilfred Lacy is an 79 y.o. female.  HPI: 79 yr female with hx HTN x 5-7 yr, hx M, anxiety, PVD, ^ lipids, kDJD, IBS who presented yesterday to Dr. Jarold Motto with dyspnea progressive for 2 wk.  Some hx vague L arm pain.  Fournd to have pos ENZ, CHF by CXR. And Cr or 1.82, now 1.95.  Asked to see for AKI and ? CKD. Cr 1.03 1 1/2 yr ago.  Has had PND, orthop, and ankle edema at home.  Was on Valsartan at home. Constitutional: as above, no energy, SOB with minimal movement Eyes: hx catarract ex, good vision Respiratory: as above, had coughing episodes, now some better Cardiovascular: as above Gastrointestinal: negative Genitourinary:negative Integument/breast: bruises Musculoskeletal:negative Behavioral/Psych: positive for anxiety Allergic/Immunologic: codiene upsets stom No hx stones, UTIs, no FH renal disease        Past Medical History  Diagnosis Date  . Gait instability   . Insomnia   . Anxiety   . IBS (irritable bowel syndrome)   . Diverticulosis   . Shortness of breath   . Heart murmur   . Peripheral vascular disease   . Arthritis   . Dyslipidemia   . Peripheral neuropathy     Past Surgical History  Procedure Laterality Date  . Cholecystectomy    . Cataract extraction    . Tah and bso    . Abdominal hysterectomy      History reviewed. No pertinent family history.  Social History:  reports that she has never smoked. She has never used smokeless tobacco. She reports that she does not drink alcohol or use illicit drugs.  Allergies:  Allergies  Allergen Reactions  . Codeine Nausea And Vomiting    Medications:  I have reviewed the patient's current medications. Prior to Admission:  Prescriptions prior to admission  Medication Sig Dispense Refill  . ALLOPURINOL PO Take 1 tablet by mouth daily.      . diazepam (VALIUM) 5 MG tablet Take 2.5 mg by mouth at bedtime.       . gabapentin (NEURONTIN) 300  MG capsule Take 900 mg by mouth at bedtime.      Marland Kitchen levothyroxine (SYNTHROID, LEVOTHROID) 88 MCG tablet Take 88 mcg by mouth daily.      . nortriptyline (PAMELOR) 25 MG capsule Take 25 mg by mouth at bedtime.      . valsartan-hydrochlorothiazide (DIOVAN-HCT) 320-25 MG per tablet Take 1 tablet by mouth daily at 12 noon.  30 tablet  12     Results for orders placed during the hospital encounter of July 02, 2013 (from the past 48 hour(s))  MRSA PCR SCREENING     Status: None   Collection Time    Jul 02, 2013  3:05 PM      Result Value Range   MRSA by PCR NEGATIVE  NEGATIVE   Comment:            The GeneXpert MRSA Assay (FDA     approved for NASAL specimens     only), is one component of a     comprehensive MRSA colonization     surveillance program. It is not     intended to diagnose MRSA     infection nor to guide or     monitor treatment for     MRSA infections.  CBC     Status: Abnormal   Collection Time    02-Jul-2013  4:33 PM      Result Value  Range   WBC 12.4 (*) 4.0 - 10.5 K/uL   RBC 4.11  3.87 - 5.11 MIL/uL   Hemoglobin 12.4  12.0 - 15.0 g/dL   HCT 16.1  09.6 - 04.5 %   MCV 93.7  78.0 - 100.0 fL   MCH 30.2  26.0 - 34.0 pg   MCHC 32.2  30.0 - 36.0 g/dL   RDW 40.9  81.1 - 91.4 %   Platelets 227  150 - 400 K/uL  COMPREHENSIVE METABOLIC PANEL     Status: Abnormal   Collection Time    08-Jul-2013  4:33 PM      Result Value Range   Sodium 137  135 - 145 mEq/L   Potassium 4.8  3.5 - 5.1 mEq/L   Chloride 101  96 - 112 mEq/L   CO2 24  19 - 32 mEq/L   Glucose, Bld 178 (*) 70 - 99 mg/dL   BUN 57 (*) 6 - 23 mg/dL   Creatinine, Ser 7.82 (*) 0.50 - 1.10 mg/dL   Calcium 9.0  8.4 - 95.6 mg/dL   Total Protein 6.7  6.0 - 8.3 g/dL   Albumin 3.1 (*) 3.5 - 5.2 g/dL   AST 40 (*) 0 - 37 U/L   ALT 46 (*) 0 - 35 U/L   Alkaline Phosphatase 118 (*) 39 - 117 U/L   Total Bilirubin 0.8  0.3 - 1.2 mg/dL   GFR calc non Af Amer 24 (*) >90 mL/min   GFR calc Af Amer 28 (*) >90 mL/min   Comment: (NOTE)      The eGFR has been calculated using the CKD EPI equation.     This calculation has not been validated in all clinical situations.     eGFR's persistently <90 mL/min signify possible Chronic Kidney     Disease.  PRO B NATRIURETIC PEPTIDE     Status: Abnormal   Collection Time    07-08-13  4:33 PM      Result Value Range   Pro B Natriuretic peptide (BNP) 26142.0 (*) 0 - 450 pg/mL  URINALYSIS, ROUTINE W REFLEX MICROSCOPIC     Status: Abnormal   Collection Time    07/08/13  6:26 PM      Result Value Range   Color, Urine AMBER (*) YELLOW   Comment: BIOCHEMICALS MAY BE AFFECTED BY COLOR   APPearance CLEAR  CLEAR   Specific Gravity, Urine 1.027  1.005 - 1.030   pH 5.5  5.0 - 8.0   Glucose, UA NEGATIVE  NEGATIVE mg/dL   Hgb urine dipstick NEGATIVE  NEGATIVE   Bilirubin Urine SMALL (*) NEGATIVE   Ketones, ur NEGATIVE  NEGATIVE mg/dL   Protein, ur NEGATIVE  NEGATIVE mg/dL   Urobilinogen, UA 1.0  0.0 - 1.0 mg/dL   Nitrite NEGATIVE  NEGATIVE   Leukocytes, UA TRACE (*) NEGATIVE  URINE MICROSCOPIC-ADD ON     Status: None   Collection Time    July 08, 2013  6:26 PM      Result Value Range   Squamous Epithelial / LPF RARE  RARE   WBC, UA 0-2  <3 WBC/hpf   Bacteria, UA RARE  RARE  TROPONIN I     Status: Abnormal   Collection Time    2013-07-08  7:55 PM      Result Value Range   Troponin I 1.68 (*) <0.30 ng/mL   Comment: CRITICAL RESULT CALLED TO, READ BACK BY AND VERIFIED WITH:     SPOKE WITH COBB,D RN [id number]  [id number] HAMER.N                Due to the release kinetics of cTnI,     a negative result within the first hours     of the onset of symptoms does not rule out     myocardial infarction with certainty.     If myocardial infarction is still suspected,     repeat the test at appropriate intervals.  BASIC METABOLIC PANEL     Status: Abnormal   Collection Time    06/20/13  1:20 AM      Result Value Range   Sodium 135  135 - 145 mEq/L   Potassium 5.0  3.5 - 5.1 mEq/L   Chloride 100  96 - 112  mEq/L   CO2 24  19 - 32 mEq/L   Glucose, Bld 238 (*) 70 - 99 mg/dL   BUN 60 (*) 6 - 23 mg/dL   Creatinine, Ser 9.14 (*) 0.50 - 1.10 mg/dL   Calcium 8.6  8.4 - 78.2 mg/dL   GFR calc non Af Amer 22 (*) >90 mL/min   GFR calc Af Amer 26 (*) >90 mL/min   Comment: (NOTE)     The eGFR has been calculated using the CKD EPI equation.     This calculation has not been validated in all clinical situations.     eGFR's persistently <90 mL/min signify possible Chronic Kidney     Disease.  CBC     Status: Abnormal   Collection Time    06/20/13  1:20 AM      Result Value Range   WBC 11.9 (*) 4.0 - 10.5 K/uL   RBC 3.92  3.87 - 5.11 MIL/uL   Hemoglobin 12.2  12.0 - 15.0 g/dL   HCT 95.6  21.3 - 08.6 %   MCV 93.9  78.0 - 100.0 fL   MCH 31.1  26.0 - 34.0 pg   MCHC 33.2  30.0 - 36.0 g/dL   RDW 57.8  46.9 - 62.9 %   Platelets 242  150 - 400 K/uL  TROPONIN I     Status: Abnormal   Collection Time    06/20/13  1:20 AM      Result Value Range   Troponin I 1.68 (*) <0.30 ng/mL   Comment: SPOKE WITH KELLAM,M 0215 [id number] HAMER,N                Due to the release kinetics of cTnI,     a negative result within the first hours     of the onset of symptoms does not rule out     myocardial infarction with certainty.     If myocardial infarction is still suspected,     repeat the test at appropriate intervals.     CRITICAL RESULT CALLED TO, READ BACK BY AND VERIFIED WITH:     SPOKE WITH KELLAM,M 0215 [id number] HAMER,N  TROPONIN I     Status: Abnormal   Collection Time    06/20/13  7:25 AM      Result Value Range   Troponin I 1.30 (*) <0.30 ng/mL   Comment:            Due to the release kinetics of cTnI,     a negative result within the first hours     of the onset of symptoms does not rule out     myocardial infarction with certainty.     If myocardial infarction is still suspected,  repeat the test at appropriate intervals.     CRITICAL VALUE NOTED.  VALUE IS CONSISTENT WITH PREVIOUSLY REPORTED  AND CALLED VALUE.  TROPONIN I     Status: Abnormal   Collection Time    06/20/13  1:23 PM      Result Value Range   Troponin I 1.07 (*) <0.30 ng/mL   Comment:            Due to the release kinetics of cTnI,     a negative result within the first hours     of the onset of symptoms does not rule out     myocardial infarction with certainty.     If myocardial infarction is still suspected,     repeat the test at appropriate intervals.     CRITICAL VALUE NOTED.  VALUE IS CONSISTENT WITH PREVIOUSLY REPORTED AND CALLED VALUE.    X-ray Chest Pa And Lateral   2013/07/02   CLINICAL DATA:  Shortness of breath, cough  EXAM: CHEST  2 VIEW  COMPARISON:  03/22/2012  FINDINGS: Cardiac silhouette is enlarged. There is increased density project within the lung bases left greater than right. Is blunting of left costophrenic angle. There is prominence of the interstitial markings and areas of peribronchial cuffing. The osseous structures unremarkable.  IMPRESSION: Atelectasis versus infiltrate within the lung bases versus asymmetric edema. Interstitial infiltrate likely representing pulmonary edema. Small effusion on the left is also diagnostic consideration.   Electronically Signed   By: Salome Holmes M.D.   On: 07-02-2013 17:09    ROS Blood pressure 98/52, pulse 87, temperature 97.8 F (36.6 C), temperature source Oral, resp. rate 24, height 5' 6.5" (1.689 m), weight 88.2 kg (194 lb 7.1 oz), SpO2 95.00%. Physical Exam Physical Examination: General appearance - overweight and dyspneic with minimal activity Mental status - alert, oriented to person, place, and time, anxious Eyes - pupils equal and reactive, extraocular eye movements intact, funduscopic exam normal, discs flat and sharp Mouth - mucous membranes moist, pharynx normal without lesions Neck - adenopathy noted PCL Lymphatics - posterior cervical nodes Chest - rales noted bibasilar, decreased air entry noted bilat Heart - S1 and S2 normal,  systolic murmur Gr2/6 at lower left sternal border Abdomen - obese,pos bs, liver down 5 cm Musculoskeletal - no joint tenderness, deformity or swelling Extremities - pedal edema 1+, trophic changes on feet, rash on top of L foot Skin - pale, rash as above, pale  Assessment/Plan: 1 AKI/CKD not exactly sure what GFR is at baseline but suspect better than now and is worsening.  Hemodynamics clearly play a role now with low bp, and hx ARB use, but not sure that is the only thing.  Need to let bp stablize or improve before any intervention. Can tolerate current vol and breathing and keep vol even.  If Cr improves, will decrease risk.  Absolutely does not want dialysis.  Risk of AKI furthur about 60% and early dialysis 13-15%.  Very concerned about whole picture with ?ischemia, ? Valvular disease, or other etiologies.   Discussed with Dr. Wylene Simmer and Dr. Sharyn Lull and conveyed my concerns and recommendations for delay 2 CHF 3 Hypertension: actually low  bp now which is primary issue in renal dysfunction 4. ^ Lipid 5. Pos enz 6 Anxiety P U/S, hold Lasix, check U Na and Cr in 8 h, hold off cath if poss. Educate on risks.    Benji Poynter L 06/20/2013, 4:11 PM

## 2013-06-20 NOTE — Consult Note (Signed)
Reason for Consult: Recent MI/congestive heart failure Referring Physician: Dr. [name] is an 79 y.o. female.  HPI: Patient is 79 year old female with past rectal history significant for hypertension, non-insulin-dependent diabetes mellitus controlled by diet, hypercholesteremia, hypothyroidism, morbid obesity, peripheral neuropathy, gouty arthritis, degenerative joint disease, was admitted yesterday because of progressive increasing shortness of breath associated with leg swelling feeling tired fatigue no energy for last 1 week. Patient complained of chest tightness radiating to left shoulder and left arm about a week ago did not seek any medical attention. Went to PMDs office and was noted to be in mild pulmonary edema requiring admission patient was noted to have elevated troponin I. EKG done showed normal sinus rhythm with the Q waves in anterolateral with ST elevation and also Q waves in inferior leads. Patient presently denies any chest pain but states feels weak tired fatigued and short of breath. Denies any palpitation lightheadedness or syncope. Denies any PND orthopnea but complains of a leg swelling lately.  Past Medical History  Diagnosis Date  . Gait instability   . Insomnia   . Anxiety   . IBS (irritable bowel syndrome)   . Diverticulosis   . Shortness of breath   . Heart murmur   . Peripheral vascular disease   . Arthritis   . Dyslipidemia   . Peripheral neuropathy     Past Surgical History  Procedure Laterality Date  . Cholecystectomy    . Cataract extraction    . Tah and bso    . Abdominal hysterectomy      History reviewed. No pertinent family history.  Social History:  reports that she has never smoked. She has never used smokeless tobacco. She reports that she does not drink alcohol or use illicit drugs.  Allergies:  Allergies  Allergen Reactions  . Codeine Nausea And Vomiting    Medications: I have reviewed the patient's current  medications.  Results for orders placed during the hospital encounter of 06/30/2013 (from the past 48 hour(s))  MRSA PCR SCREENING     Status: None   Collection Time    07/11/2013  3:05 PM      Result Value Range   MRSA by PCR NEGATIVE  NEGATIVE   Comment:            The GeneXpert MRSA Assay (FDA     approved for NASAL specimens     only), is one component of a     comprehensive MRSA colonization     surveillance program. It is not     intended to diagnose MRSA     infection nor to guide or     monitor treatment for     MRSA infections.  CBC     Status: Abnormal   Collection Time    June 29, 2013  4:33 PM      Result Value Range   WBC 12.4 (*) 4.0 - 10.5 K/uL   RBC 4.11  3.87 - 5.11 MIL/uL   Hemoglobin 12.4  12.0 - 15.0 g/dL   HCT 16.1  09.6 - 04.5 %   MCV 93.7  78.0 - 100.0 fL   MCH 30.2  26.0 - 34.0 pg   MCHC 32.2  30.0 - 36.0 g/dL   RDW 40.9  81.1 - 91.4 %   Platelets 227  150 - 400 K/uL  COMPREHENSIVE METABOLIC PANEL     Status: Abnormal   Collection Time    06/30/2013  4:33 PM  Result Value Range   Sodium 137  135 - 145 mEq/L   Potassium 4.8  3.5 - 5.1 mEq/L   Chloride 101  96 - 112 mEq/L   CO2 24  19 - 32 mEq/L   Glucose, Bld 178 (*) 70 - 99 mg/dL   BUN 57 (*) 6 - 23 mg/dL   Creatinine, Ser 4.09 (*) 0.50 - 1.10 mg/dL   Calcium 9.0  8.4 - 81.1 mg/dL   Total Protein 6.7  6.0 - 8.3 g/dL   Albumin 3.1 (*) 3.5 - 5.2 g/dL   AST 40 (*) 0 - 37 U/L   ALT 46 (*) 0 - 35 U/L   Alkaline Phosphatase 118 (*) 39 - 117 U/L   Total Bilirubin 0.8  0.3 - 1.2 mg/dL   GFR calc non Af Amer 24 (*) >90 mL/min   GFR calc Af Amer 28 (*) >90 mL/min   Comment: (NOTE)     The eGFR has been calculated using the CKD EPI equation.     This calculation has not been validated in all clinical situations.     eGFR's persistently <90 mL/min signify possible Chronic Kidney     Disease.  PRO B NATRIURETIC PEPTIDE     Status: Abnormal   Collection Time    06/16/2013  4:33 PM      Result Value Range    Pro B Natriuretic peptide (BNP) 26142.0 (*) 0 - 450 pg/mL  URINALYSIS, ROUTINE W REFLEX MICROSCOPIC     Status: Abnormal   Collection Time    06/07/2013  6:26 PM      Result Value Range   Color, Urine AMBER (*) YELLOW   Comment: BIOCHEMICALS MAY BE AFFECTED BY COLOR   APPearance CLEAR  CLEAR   Specific Gravity, Urine 1.027  1.005 - 1.030   pH 5.5  5.0 - 8.0   Glucose, UA NEGATIVE  NEGATIVE mg/dL   Hgb urine dipstick NEGATIVE  NEGATIVE   Bilirubin Urine SMALL (*) NEGATIVE   Ketones, ur NEGATIVE  NEGATIVE mg/dL   Protein, ur NEGATIVE  NEGATIVE mg/dL   Urobilinogen, UA 1.0  0.0 - 1.0 mg/dL   Nitrite NEGATIVE  NEGATIVE   Leukocytes, UA TRACE (*) NEGATIVE  URINE MICROSCOPIC-ADD ON     Status: None   Collection Time    06/21/2013  6:26 PM      Result Value Range   Squamous Epithelial / LPF RARE  RARE   WBC, UA 0-2  <3 WBC/hpf   Bacteria, UA RARE  RARE  TROPONIN I     Status: Abnormal   Collection Time    06/12/2013  7:55 PM      Result Value Range   Troponin I 1.68 (*) <0.30 ng/mL   Comment: CRITICAL RESULT CALLED TO, READ BACK BY AND VERIFIED WITH:     SPOKE WITH COBB,D RN 2052 [id number] [name]                Due to the release kinetics of cTnI,     a negative result within the first hours     of the onset of symptoms does not rule out     myocardial infarction with certainty.     If myocardial infarction is still suspected,     repeat the test at appropriate intervals.  BASIC METABOLIC PANEL     Status: Abnormal   Collection Time    06/20/13  1:20 Cannon      Result Value Range   Sodium  135  135 - 145 mEq/L   Potassium 5.0  3.5 - 5.1 mEq/L   Chloride 100  96 - 112 mEq/L   CO2 24  19 - 32 mEq/L   Glucose, Bld 238 (*) 70 - 99 mg/dL   BUN 60 (*) 6 - 23 mg/dL   Creatinine, Ser 1.61 (*) 0.50 - 1.10 mg/dL   Calcium 8.6  8.4 - 09.6 mg/dL   GFR calc non Af Amer 22 (*) >90 mL/min   GFR calc Af Amer 26 (*) >90 mL/min   Comment: (NOTE)     The eGFR has been calculated using the CKD EPI  equation.     This calculation has not been validated in all clinical situations.     eGFR's persistently <90 mL/min signify possible Chronic Kidney     Disease.  CBC     Status: Abnormal   Collection Time    06/20/13  1:20 Cannon      Result Value Range   WBC 11.9 (*) 4.0 - 10.5 K/uL   RBC 3.92  3.87 - 5.11 MIL/uL   Hemoglobin 12.2  12.0 - 15.0 g/dL   HCT 04.5  40.9 - 81.1 %   MCV 93.9  78.0 - 100.0 fL   MCH 31.1  26.0 - 34.0 pg   MCHC 33.2  30.0 - 36.0 g/dL   RDW 91.4  78.2 - 95.6 %   Platelets 242  150 - 400 K/uL  TROPONIN I     Status: Abnormal   Collection Time    06/20/13  1:20 Cannon      Result Value Range   Troponin I 1.68 (*) <0.30 ng/mL   Comment: SPOKE WITH KELLAM,M 0215 [id number] HAMER,Marie                Due to the release kinetics of cTnI,     a negative result within the first hours     of the onset of symptoms does not rule out     myocardial infarction with certainty.     If myocardial infarction is still suspected,     repeat the test at appropriate intervals.     CRITICAL RESULT CALLED TO, READ BACK BY AND VERIFIED WITH:     SPOKE WITH KELLAM,M 0215 [id number] HAMER,Marie  TROPONIN I     Status: Abnormal   Collection Time    06/20/13  7:25 Cannon      Result Value Range   Troponin I 1.30 (*) <0.30 ng/mL   Comment:            Due to the release kinetics of cTnI,     a negative result within the first hours     of the onset of symptoms does not rule out     myocardial infarction with certainty.     If myocardial infarction is still suspected,     repeat the test at appropriate intervals.     CRITICAL VALUE NOTED.  VALUE IS CONSISTENT WITH PREVIOUSLY REPORTED AND CALLED VALUE.    X-ray Chest Pa And Lateral   05/30/2013   CLINICAL DATA:  Shortness of breath, cough  EXAM: CHEST  2 VIEW  COMPARISON:  03/22/2012  FINDINGS: Cardiac silhouette is enlarged. There is increased density project within the lung bases left greater than right. Is blunting of left costophrenic angle. There  is prominence of the interstitial markings and areas of peribronchial cuffing. The osseous structures unremarkable.  IMPRESSION: Atelectasis versus infiltrate within the lung bases  versus asymmetric edema. Interstitial infiltrate likely representing pulmonary edema. Small effusion on the left is also diagnostic consideration.   Electronically Signed   By: Salome Holmes M.D.   On: 06/05/2013 17:09    Review of Systems  Constitutional: Positive for malaise/fatigue. Negative for fever.  Eyes: Negative for double vision, photophobia and pain.  Respiratory: Positive for cough and shortness of breath.   Cardiovascular: Positive for leg swelling. Negative for chest pain, palpitations and orthopnea.  Gastrointestinal: Negative for nausea, vomiting and abdominal pain.  Genitourinary: Negative for dysuria.  Neurological: Positive for weakness. Negative for dizziness and headaches.   Blood pressure 98/52, pulse 87, temperature 97.8 F (36.6 C), temperature source Oral, resp. rate 24, height 5' 6.5" (1.689 m), weight 88.2 kg (194 lb 7.1 oz), SpO2 95.00%. Physical Exam  HENT:  Head: Normocephalic.  Eyes: Conjunctivae are normal. Left eye exhibits no discharge. No scleral icterus.  Neck: Normal range of motion. Neck supple. No JVD present. No tracheal deviation present. No thyromegaly present.  Cardiovascular:  Regular rate and rhythm S1-S2 soft there is soft systolic and diastolic murmur and S3 gallop  Respiratory:  Decreased breath sound at bases with basilar faint rales  GI: Soft. Bowel sounds are normal. She exhibits no distension. There is no tenderness. There is no rebound and no guarding.  Musculoskeletal:  No clubbing cyanosis 1+ edema noted    Assessment/Plan: Status post recent anterolateral wall myocardial infarction complicated by acute pulmonary edema Silent inferior wall MI age undetermined Multivessel coronary artery disease Ischemic cardiomyopathy Resolving decompensated acute  systolic heart failure Hypertension Diabetes mellitus Hypercholesteremia Hypothyroidism Acute on chronic kidney disease stage IV Peripheral neuropathy Gouty arthritis Degenerative joint disease Plan Continue present management Check serial enzymes and EKG We'll add beta blockers and ACE inhibitors as blood pressure and renal function tolerates Discussed with patient regarding various options of treatment i.e. medical versus invasive left cath possible PCI is risk and benefits i.e. death MI stroke need for emergency CABG local vascular complications worsening renal function etc. and consents for PCI Check 2-D echo We'll tentatively schedule her for tomorrow for left cath possible PCI  [facility] Marie Cannon, Marie Cannon

## 2013-06-20 NOTE — Progress Notes (Signed)
Subjective: Dyspnea is a bit better than yesterday and she is still coughing. She strongly emphasized to me and her daughter that she wants a full DNR order and would rather not live in her current compromised situation. She does agree to have a cardiologist and nephrologist evaluation that I am recommending.  Objective: Vital signs in last 24 hours: Temp:  [97.8 F (36.6 C)-98.7 F (37.1 C)] 97.8 F (36.6 C) (12/23 0400) Pulse Rate:  [74-84] 77 (12/23 0600) Resp:  [16-36] 16 (12/23 0600) BP: (89-125)/(56-94) 89/56 mmHg (12/23 0600) SpO2:  [91 %-100 %] 92 % (12/23 0600) Weight:  [88.2 kg (194 lb 7.1 oz)-88.7 kg (195 lb 8.8 oz)] 88.2 kg (194 lb 7.1 oz) (12/23 0500) Weight change:    Intake/Output from previous day: 12/22 0701 - 12/23 0700 In: 265 [P.O.:120; I.V.:145] Out: 1225 [Urine:1225]   General appearance: alert, cooperative, mild distress and with frequent dry cough Resp: crackles in the bilateral lower lung fields Cardio: regular rate and rhythm GI: soft, non-tender; bowel sounds normal; no masses,  no organomegaly Extremities: bilateral 1+ leg edema  Lab Results:  Recent Labs  July 11, 2013 1633 06/20/13 0120  WBC 12.4* 11.9*  HGB 12.4 12.2  HCT 38.5 36.8  PLT 227 242   BMET  Recent Labs  2013/07/11 1633 06/20/13 0120  NA 137 135  K 4.8 5.0  CL 101 100  CO2 24 24  GLUCOSE 178* 238*  BUN 57* 60*  CREATININE 1.82* 1.95*  CALCIUM 9.0 8.6   CMET CMP     Component Value Date/Time   NA 135 06/20/2013 0120   K 5.0 06/20/2013 0120   CL 100 06/20/2013 0120   CO2 24 06/20/2013 0120   GLUCOSE 238* 06/20/2013 0120   BUN 60* 06/20/2013 0120   CREATININE 1.95* 06/20/2013 0120   CALCIUM 8.6 06/20/2013 0120   PROT 6.7 07/11/13 1633   ALBUMIN 3.1* 07/11/13 1633   AST 40* 07-11-2013 1633   ALT 46* 07/11/13 1633   ALKPHOS 118* 07-11-2013 1633   BILITOT 0.8 2013-07-11 1633   GFRNONAA 22* 06/20/2013 0120   GFRAA 26* 06/20/2013 0120    CBG (last 3)  No  results found for this basename: GLUCAP,  in the last 72 hours  INR RESULTS:   No results found for this basename: INR, PROTIME     Studies/Results: X-ray Chest Pa And Lateral   2013-07-11   CLINICAL DATA:  Shortness of breath, cough  EXAM: CHEST  2 VIEW  COMPARISON:  03/22/2012  FINDINGS: Cardiac silhouette is enlarged. There is increased density project within the lung bases left greater than right. Is blunting of left costophrenic angle. There is prominence of the interstitial markings and areas of peribronchial cuffing. The osseous structures unremarkable.  IMPRESSION: Atelectasis versus infiltrate within the lung bases versus asymmetric edema. Interstitial infiltrate likely representing pulmonary edema. Small effusion on the left is also diagnostic consideration.   Electronically Signed   By: Salome Holmes M.D.   On: Jul 11, 2013 17:09    Medications: I have reviewed the patient's current medications.  Assessment/Plan: #1 Dyspnea: most likely due to acute CHF from a recent anterior wall MI. Her EKG is changed from her baseline with lack of R waves and ST elevation across the precordial leads that is new for her. Other causes such as PE are unlikely. We will request a cardiologist consultation, hold off on adding a beta blocker due to low BP, and have an echo done today. #2 Acute Kidney Failure: interval  rise in her serum creatinine level from her baseline of about 1.2, likely due to hypotension and hypoperfusion of the kidneys. Intravascular volume status is difficult to determine. Will request nephrologist evalution to help optimize renal function.   LOS: 1 day   Cyniah Gossard G 06/20/2013, 7:56 AM

## 2013-06-20 NOTE — Progress Notes (Signed)
CRITICAL VALUE ALERT  Critical value received:  Troponin 1.6  Date of notification:  06/20/2013  Time of notification:  0120  Critical value read back:yes  Nurse who received alert:  mk  MD notified (1st page):  None, same result as previous one, attributed to chf, not MI  Time of first page:  none  MD notified (2nd page):  Time of second page:  Responding MD:  none  Time MD responded:  not

## 2013-06-20 NOTE — Progress Notes (Signed)
Inpatient Diabetes Program Recommendations  AACE/ADA: New Consensus Statement on Inpatient Glycemic Control (2013)  Target Ranges:  Prepandial:   less than 140 mg/dL      Peak postprandial:   less than 180 mg/dL (1-2 hours)      Critically ill patients:  140 - 180 mg/dL   Reason for Visit: Hyperglycemia   Results for Marie Cannon, Marie Cannon (MRN [id number]) as of 06/20/2013 14:40  Ref. Range 2013-07-19 16:33 06/20/2013 01:20  Glucose Latest Range: 70-99 mg/dL 409 (H) 811 (H)    Inpatient Diabetes Program Recommendations Correction (SSI): Add Novolog sensitive tidwc Diet: Add CHO mod med to heart healthy diet  Note: Hx of NIDDM. No meds at home. Will follow. Thank you. Ailene Ards, RD, LDN, CDE Inpatient Diabetes Coordinator [phone]

## 2013-06-20 NOTE — Progress Notes (Signed)
Echocardiogram 2D Echocardiogram has been performed.  Marie Cannon 06/20/2013, 12:13 PM

## 2013-06-20 NOTE — Progress Notes (Signed)
CARE MANAGEMENT NOTE 06/20/2013  Patient:  Marie Cannon, Marie Cannon   Account Number:  [id number]  Date Initiated:  06/20/2013  Documentation initiated by:  Ramiya Delahunty  Subjective/Objective Assessment:   pt was seen in the [facility] for weakness and instability to stand dyspneic on arrival and sent for admission, ekg positive for nstemi and troponin elevated     Action/Plan:   home with husband when stable   Anticipated DC Date:  06/23/2013   Anticipated DC Plan:  HOME/SELF CARE         Choice offered to / List presented to:             Status of service:  In process, will continue to follow Medicare Important Message given?   (If response is "NO", the following Medicare IM given date fields will be blank) Date Medicare IM given:   Date Additional Medicare IM given:    Discharge Disposition:    Per UR Regulation:  Reviewed for med. necessity/level of care/duration of stay  If discussed at Long Length of Stay Meetings, dates discussed:    Comments:  [id number]/Mccartney Brucks Stark Jock, BSN, [place] [phone] Chart Reviewed for discharge and hospital needs. Discharge needs at time of review:  None present will follow for needs. Review of patient progress due on [id number].

## 2013-06-21 ENCOUNTER — Inpatient Hospital Stay (HOSPITAL_COMMUNITY): Payer: Medicare Other

## 2013-06-21 ENCOUNTER — Encounter (HOSPITAL_COMMUNITY): Admission: AD | Disposition: E | Payer: Self-pay | Source: Ambulatory Visit | Attending: Internal Medicine

## 2013-06-21 LAB — GLUCOSE, CAPILLARY

## 2013-06-21 LAB — CBC
HCT: 40.4 % (ref 36.0–46.0)
MCHC: 31.9 g/dL (ref 30.0–36.0)
MCV: 95.1 fL (ref 78.0–100.0)
Platelets: 299 10*3/uL (ref 150–400)
RDW: 14.4 % (ref 11.5–15.5)
WBC: 15.4 10*3/uL — ABNORMAL HIGH (ref 4.0–10.5)

## 2013-06-21 LAB — COMPREHENSIVE METABOLIC PANEL
ALT: 41 U/L — ABNORMAL HIGH (ref 0–35)
AST: 33 U/L (ref 0–37)
Alkaline Phosphatase: 113 U/L (ref 39–117)
BUN: 71 mg/dL — ABNORMAL HIGH (ref 6–23)
CO2: 24 mEq/L (ref 19–32)
Calcium: 8.4 mg/dL (ref 8.4–10.5)
Chloride: 100 mEq/L (ref 96–112)
Creatinine, Ser: 2.06 mg/dL — ABNORMAL HIGH (ref 0.50–1.10)
GFR calc Af Amer: 24 mL/min — ABNORMAL LOW (ref >90)
GFR calc non Af Amer: 21 mL/min — ABNORMAL LOW (ref >90)
Glucose, Bld: 162 mg/dL — ABNORMAL HIGH (ref 70–99)
Potassium: 4.6 mEq/L (ref 3.5–5.1)
Total Bilirubin: 0.7 mg/dL (ref 0.3–1.2)

## 2013-06-21 LAB — PHOSPHORUS: Phosphorus: 4.6 mg/dL (ref 2.3–4.6)

## 2013-06-21 LAB — SODIUM, URINE, RANDOM: Sodium, Ur: 11 mEq/L

## 2013-06-21 LAB — PARATHYROID HORMONE, INTACT (NO CA): PTH: 212.3 pg/mL — ABNORMAL HIGH (ref 14.0–72.0)

## 2013-06-21 LAB — TROPONIN I: Troponin I: 1.4 ng/mL (ref <0.30)

## 2013-06-21 LAB — HEMOGLOBIN A1C: Mean Plasma Glucose: 183 mg/dL — ABNORMAL HIGH (ref <117)

## 2013-06-21 SURGERY — LEFT HEART CATHETERIZATION WITH CORONARY ANGIOGRAM
Anesthesia: LOCAL

## 2013-06-21 MED ORDER — MORPHINE SULFATE 2 MG/ML IJ SOLN
2.0000 mg | INTRAMUSCULAR | Status: DC | PRN
  Administered 2013-06-21 (×2): 2 mg via INTRAVENOUS
  Filled 2013-06-21: qty 1

## 2013-06-21 MED ORDER — ENSURE PUDDING PO PUDG
1.0000 | Freq: Three times a day (TID) | ORAL | Status: DC
  Administered 2013-06-21 – 2013-06-25 (×11): 1 via ORAL
  Filled 2013-06-21 (×16): qty 1

## 2013-06-21 MED ORDER — GABAPENTIN 300 MG PO CAPS
300.0000 mg | ORAL_CAPSULE | Freq: Every day | ORAL | Status: DC
  Administered 2013-06-21 – 2013-06-24 (×4): 300 mg via ORAL
  Filled 2013-06-21 (×6): qty 1

## 2013-06-21 MED ORDER — MORPHINE SULFATE 2 MG/ML IJ SOLN
2.0000 mg | INTRAMUSCULAR | Status: DC | PRN
  Administered 2013-06-21 – 2013-06-22 (×6): 2 mg via INTRAVENOUS
  Filled 2013-06-21 (×6): qty 1

## 2013-06-21 MED ORDER — LORAZEPAM 2 MG/ML IJ SOLN
2.0000 mg | INTRAMUSCULAR | Status: DC | PRN
  Administered 2013-06-22: 1 mg via INTRAVENOUS
  Administered 2013-06-22 – 2013-06-26 (×4): 2 mg via INTRAVENOUS
  Filled 2013-06-21 (×5): qty 1

## 2013-06-21 MED ORDER — LORAZEPAM 2 MG/ML IJ SOLN
INTRAMUSCULAR | Status: AC
  Administered 2013-06-21: 2 mg
  Filled 2013-06-21: qty 1

## 2013-06-21 MED ORDER — MORPHINE SULFATE 2 MG/ML IJ SOLN
INTRAMUSCULAR | Status: AC
  Filled 2013-06-21: qty 1

## 2013-06-21 NOTE — Progress Notes (Signed)
Subjective: Interval History: has no complaint, but agitated last pm and given sedation.  Objective: Vital signs in last 24 hours: Temp:  [97.7 F (36.5 C)-97.9 F (36.6 C)] 97.7 F (36.5 C) (12/24 0800) Pulse Rate:  [72-89] 81 (12/24 1000) Resp:  [14-29] 18 (12/24 1000) BP: (80-108)/(51-71) 91/60 mmHg (12/24 1000) SpO2:  [93 %-99 %] 95 % (12/24 1000) Weight change:   Intake/Output from previous day: 12/23 0701 - 12/24 0700 In: 960 [P.O.:750; I.V.:210] Out: 350 [Urine:350] Intake/Output this shift: Total I/O In: 30 [I.V.:30] Out: 350 [Urine:350]  General appearance: moderately obese, pale and slowed mentation Resp: diminished breath sounds bilaterally and rales bibasilar Cardio: S1, S2 normal and systolic murmur: holosystolic 2/6, blowing at apex GI: obese,pos bs, liver down 5 cm Extremities: edema 2+  Lab Results:  Recent Labs  06/20/13 0120 06/03/2013 0358  WBC 11.9* 15.4*  HGB 12.2 12.9  HCT 36.8 40.4  PLT 242 299   BMET:  Recent Labs  06/20/13 0120 06/22/2013 0358  NA 135 136  K 5.0 4.6  CL 100 100  CO2 24 24  GLUCOSE 238* 162*  BUN 60* 71*  CREATININE 1.95* 2.06*  CALCIUM 8.6 8.4   No results found for this basename: PTH,  in the last 72 hours Iron Studies: No results found for this basename: IRON, TIBC, TRANSFERRIN, FERRITIN,  in the last 72 hours  Studies/Results: X-ray Chest Pa And Lateral   06/20/13   CLINICAL DATA:  Shortness of breath, cough  EXAM: CHEST  2 VIEW  COMPARISON:  03/22/2012  FINDINGS: Cardiac silhouette is enlarged. There is increased density project within the lung bases left greater than right. Is blunting of left costophrenic angle. There is prominence of the interstitial markings and areas of peribronchial cuffing. The osseous structures unremarkable.  IMPRESSION: Atelectasis versus infiltrate within the lung bases versus asymmetric edema. Interstitial infiltrate likely representing pulmonary edema. Small effusion on the left is  also diagnostic consideration.   Electronically Signed   By: Salome Holmes M.D.   On: Jun 20, 2013 17:09    I have reviewed the patient's current medications.  Assessment/Plan: 1 AKI/CKD  Not much urine. Bp more stable.  Acid/base and K ok. Vol elevated but acceptable.  Cr ^ mild, will be patient as rate of rise is slow and should turn around if hemodynamics stabilize.  Agree with conservative approach 2 MI with EF 15% and effusion per Card 3 Anxiety P limit fluids, hold of diuretics,  Follow Cr Urine chem P and U/S    LOS: 2 days   Uri Covey L 06/18/2013,11:00 AM

## 2013-06-21 NOTE — Progress Notes (Signed)
eLink Physician-Brief Progress Note Patient Name: Marie Cannon DOB: 1926/03/19 MRN: [id number]  Date of Service  06/22/2013   HPI/Events of Note  Call from nurse reporting patient c/o of intense pain below the left rib cage with pain on palpation and reduced bowel sounds.  Had received MSO4 2 mg IV without relief.  Current VSS.  Patient upset and crying in the room with nurse at bedside.   eICU Interventions  Plan: Nurse to continue to use MSO4 as ordered Ativan 2 mg IV times one for anxiety component Continue to monitor patient   Intervention Category Intermediate Interventions: Pain - evaluation and management Minor Interventions: Agitation / anxiety - evaluation and management  DETERDING,ELIZABETH 06/23/2013, 5:12 AM

## 2013-06-21 NOTE — Progress Notes (Signed)
Pt extremities are very cool to touch, nail beds and mouth area have a blue tinge. Family called to bedside.

## 2013-06-21 NOTE — Progress Notes (Addendum)
Pt now has distended neck veins and sub que air at base of neck. Recent Troponin result is elevated from previous lab result. Will paged Cardiology to advise.

## 2013-06-21 NOTE — Progress Notes (Addendum)
Paged MD on call, Pt is having sharpe excruciating pain in the epigastric area when coughing, pain is intolerable resulting in Pt yelling out.    Order given for Morphine 2mg  q 2 hrs. Administered one dose 04:57.  Pt is stating she is ready to go that she can't take this anymore. She is wishing not to be stuck anymore for labs. She is actively talking about wanting to die yelling out in pain. 2 mg Morphine is not covering pain. Called on call back and requested pain medication in drip form to make pt more comfortable.  Request denied.

## 2013-06-21 NOTE — Progress Notes (Signed)
Subjective:  Events of last night noted.  Abdominal pain better but continues to have shortness of breath no chest pain.  Reviewed findings soft 2-D echo with family.  Family decided for comfort measures only  Objective:  Vital Signs in the last 24 hours: Temp:  [97.7 F (36.5 C)-97.9 F (36.6 C)] 97.7 F (36.5 C) (12/24 0800) Pulse Rate:  [72-89] 80 (12/24 0900) Resp:  [14-29] 21 (12/24 0900) BP: (80-108)/(51-71) 94/53 mmHg (12/24 0900) SpO2:  [93 %-99 %] 93 % (12/24 0900)  Intake/Output from previous day: 12/23 0701 - 12/24 0700 In: 950 [P.O.:750; I.V.:200] Out: 350 [Urine:350] Intake/Output from this shift:    Physical Exam: Neck: JVD - 8 cm above sternal notch, no adenopathy, no carotid bruit and supple, symmetrical, trachea midline Lungs: bibasilar rales and rhonchi noted Heart: regular rate and rhythm, S1, S2 normal and soft systolic murmur and S3 gallop noted Abdomen: soft, non-tender; bowel sounds normal; no masses,  no organomegaly Extremities: no clubbing cyanosis 1+ edema noted  Lab Results:  Recent Labs  06/20/13 0120 07-14-2013 0358  WBC 11.9* 15.4*  HGB 12.2 12.9  PLT 242 299    Recent Labs  06/20/13 0120 2013/07/14 0358  NA 135 136  K 5.0 4.6  CL 100 100  CO2 24 24  GLUCOSE 238* 162*  BUN 60* 71*  CREATININE 1.95* 2.06*    Recent Labs  06/20/13 1323 2013/07/14 0358  TROPONINI 1.07* 1.40*   Hepatic Function Panel  Recent Labs  07-14-2013 0358  PROT 6.4  ALBUMIN 2.9*  AST 33  ALT 41*  ALKPHOS 113  BILITOT 0.7   No results found for this basename: CHOL,  in the last 72 hours No results found for this basename: PROTIME,  in the last 72 hours  Imaging: Imaging results have been reviewed and X-ray Chest Pa And Lateral   06/11/2013   CLINICAL DATA:  Shortness of breath, cough  EXAM: CHEST  2 VIEW  COMPARISON:  03/22/2012  FINDINGS: Cardiac silhouette is enlarged. There is increased density project within the lung bases left greater than  right. Is blunting of left costophrenic angle. There is prominence of the interstitial markings and areas of peribronchial cuffing. The osseous structures unremarkable.  IMPRESSION: Atelectasis versus infiltrate within the lung bases versus asymmetric edema. Interstitial infiltrate likely representing pulmonary edema. Small effusion on the left is also diagnostic consideration.   Electronically Signed   By: Salome Holmes M.D.   On: 06/01/2013 17:09    Cardiac Studies:  Assessment/Plan:  Status post recent anterolateral wall myocardial infarction complicated by acute pulmonary edema /pericardial effusion with impending early tamponade  Silent inferior wall MI age undetermined  Multivessel coronary artery disease  Ischemic cardiomyopathy   Decompensated acute systolic heart failure  Hypertension  Diabetes mellitus  Hypercholesteremia  Hypothyroidism  Acute on chronic kidney disease stage IV  Peripheral neuropathy  Gouty arthritis  Degenerative joint disease  Plan DC Lovenox in view of pericardial effusion and worsening renal function Continue comfort measures Dr. Jacinto Halim  will follow from tomorrow as needed  LOS: 2 days    Marie Cannon N 14-Jul-2013, 9:52 AM

## 2013-06-21 NOTE — Progress Notes (Signed)
Subjective: The patient had significant dyspnea and abdominal pain last night that resolved with treatment, and she continues to have a dry cough. She is ready to die if her condition does not improve with current treatments.  Objective: Vital signs in last 24 hours: Temp:  [97.8 F (36.6 C)-97.9 F (36.6 C)] 97.9 F (36.6 C) (12/24 0400) Pulse Rate:  [72-89] 77 (12/24 0500) Resp:  [14-29] 29 (12/24 0500) BP: (80-108)/(51-71) 108/60 mmHg (12/24 0500) SpO2:  [93 %-99 %] 99 % (12/24 0500) Weight change:    Intake/Output from previous day: 12/23 0701 - 12/24 0700 In: 950 [P.O.:750; I.V.:200] Out: 350 [Urine:350]   General appearance: alert, cooperative, mild distress and with occasional dry cough Resp: bilateral crackles lower 1/2 of both lungs, improved versus yesterday Cardio: regular rate and rhythm Extremities: bilateral 1+ leg edema, no longer has acrocyanosis  Lab Results:  Recent Labs  06/20/13 0120 07/20/2013 0358  WBC 11.9* 15.4*  HGB 12.2 12.9  HCT 36.8 40.4  PLT 242 299   BMET  Recent Labs  06/20/13 0120 07-20-2013 0358  NA 135 136  K 5.0 4.6  CL 100 100  CO2 24 24  GLUCOSE 238* 162*  BUN 60* 71*  CREATININE 1.95* 2.06*  CALCIUM 8.6 8.4   CMET CMP     Component Value Date/Time   NA 136 07/20/13 0358   K 4.6 2013/07/20 0358   CL 100 20-Jul-2013 0358   CO2 24 20-Jul-2013 0358   GLUCOSE 162* 07-20-13 0358   BUN 71* 07-20-2013 0358   CREATININE 2.06* 2013-07-20 0358   CALCIUM 8.4 July 20, 2013 0358   PROT 6.4 20-Jul-2013 0358   ALBUMIN 2.9* 07/20/2013 0358   AST 33 20-Jul-2013 0358   ALT 41* 2013-07-20 0358   ALKPHOS 113 Jul 20, 2013 0358   BILITOT 0.7 July 20, 2013 0358   GFRNONAA 21* Jul 20, 2013 0358   GFRAA 24* 07/20/13 0358    CBG (last 3)   Recent Labs  06/20/13 1700 06/20/13 2119  GLUCAP 276* 192*    INR RESULTS:   No results found for this basename: INR, PROTIME     Studies/Results: X-ray Chest Pa And Lateral   06/25/2013    CLINICAL DATA:  Shortness of breath, cough  EXAM: CHEST  2 VIEW  COMPARISON:  03/22/2012  FINDINGS: Cardiac silhouette is enlarged. There is increased density project within the lung bases left greater than right. Is blunting of left costophrenic angle. There is prominence of the interstitial markings and areas of peribronchial cuffing. The osseous structures unremarkable.  IMPRESSION: Atelectasis versus infiltrate within the lung bases versus asymmetric edema. Interstitial infiltrate likely representing pulmonary edema. Small effusion on the left is also diagnostic consideration.   Electronically Signed   By: Salome Holmes M.D.   On: 06/11/2013 17:09    Medications: I have reviewed the patient's current medications.  Assessment/Plan: #1 CHF: from ischemic dilated cardiomyopathy with dramatic change in LV Efx from 65% last year to 15% currently. Due to compromised renal function holding on cardiac cath for now. Will continue as much meds as tolerated for CAD and CHF, make sure she has adequate morphine and ativan for comfort. #2 Acute Renal Insufficiency: interval increase in serum creatinine that hopefully will improve with normalization of blood pressure and renal perfusion.   LOS: 2 days   Dino Borntreger G 07/20/2013, 8:32 AM

## 2013-06-21 NOTE — Progress Notes (Signed)
Placed pt on Venturi mask, due to de-sating while talking and taking sips.

## 2013-06-22 LAB — GLUCOSE, CAPILLARY
Glucose-Capillary: 125 mg/dL — ABNORMAL HIGH (ref 70–99)
Glucose-Capillary: 137 mg/dL — ABNORMAL HIGH (ref 70–99)
Glucose-Capillary: 105 mg/dL — ABNORMAL HIGH (ref 70–99)
Glucose-Capillary: 143 mg/dL — ABNORMAL HIGH (ref 70–99)

## 2013-06-22 LAB — CBC
MCH: 31.4 pg (ref 26.0–34.0)
MCHC: 33.1 g/dL (ref 30.0–36.0)
MCV: 95 fL (ref 78.0–100.0)
Platelets: 237 10*3/uL (ref 150–400)
RDW: 14.6 % (ref 11.5–15.5)

## 2013-06-22 LAB — RENAL FUNCTION PANEL
Albumin: 2.7 g/dL — ABNORMAL LOW (ref 3.5–5.2)
Calcium: 8.4 mg/dL (ref 8.4–10.5)
Creatinine, Ser: 1.74 mg/dL — ABNORMAL HIGH (ref 0.50–1.10)
GFR calc Af Amer: 30 mL/min — ABNORMAL LOW (ref >90)
GFR calc non Af Amer: 26 mL/min — ABNORMAL LOW (ref >90)
Phosphorus: 4.7 mg/dL — ABNORMAL HIGH (ref 2.3–4.6)

## 2013-06-22 LAB — BASIC METABOLIC PANEL
BUN: 68 mg/dL — ABNORMAL HIGH (ref 6–23)
CO2: 25 mEq/L (ref 19–32)
Calcium: 8.3 mg/dL — ABNORMAL LOW (ref 8.4–10.5)
Creatinine, Ser: 1.69 mg/dL — ABNORMAL HIGH (ref 0.50–1.10)
Glucose, Bld: 177 mg/dL — ABNORMAL HIGH (ref 70–99)
Potassium: 5.3 mEq/L — ABNORMAL HIGH (ref 3.5–5.1)

## 2013-06-22 MED ORDER — MORPHINE SULFATE 2 MG/ML IJ SOLN
2.0000 mg | INTRAMUSCULAR | Status: DC | PRN
  Administered 2013-06-22 – 2013-06-23 (×2): 2 mg via INTRAVENOUS
  Administered 2013-06-23: 4 mg via INTRAVENOUS
  Administered 2013-06-23 – 2013-06-26 (×10): 2 mg via INTRAVENOUS
  Filled 2013-06-22: qty 2
  Filled 2013-06-22 (×7): qty 1
  Filled 2013-06-22: qty 2
  Filled 2013-06-22 (×5): qty 1

## 2013-06-22 NOTE — Progress Notes (Signed)
Subjective: Interval History: has no new complaint. Oxygenation is marginal but right now sat 98% on [place]- creatinine down and uop reasonable  Objective: Vital signs in last 24 hours: Temp:  [96.3 F (35.7 C)-98.7 F (37.1 C)] 96.3 F (35.7 C) (12/25 1200) Pulse Rate:  [54-85] 75 (12/25 1000) Resp:  [11-27] 11 (12/25 1000) BP: (91-107)/(45-78) 107/52 mmHg (12/25 1000) SpO2:  [75 %-100 %] 98 % (12/25 1000) FiO2 (%):  [28 %-50 %] 50 % (12/25 0800) Weight:  [89 kg (196 lb 3.4 oz)] 89 kg (196 lb 3.4 oz) (12/25 0323) Weight change:   Intake/Output from previous day: 12/24 0701 - 12/25 0700 In: 360 [P.O.:120; I.V.:240] Out: 1135 [Urine:1135] Intake/Output this shift: Total I/O In: 30 [I.V.:30] Out: 125 [Urine:125]  General appearance: moderately obese, pale and slowed mentation Resp: diminished breath sounds bilaterally and rales bibasilar Cardio: S1, S2 normal and systolic murmur: holosystolic 2/6, blowing at apex GI: obese,pos bs, liver down 5 cm Extremities: edema 2+  Lab Results:  Recent Labs  07-06-2013 0358 06/22/13 0359  WBC 15.4* 13.1*  HGB 12.9 12.5  HCT 40.4 37.8  PLT 299 237   BMET:   Recent Labs  06/22/13 0359 06/22/13 0400  NA 137 137  K 5.3* 5.2*  CL 103 103  CO2 25 25  GLUCOSE 177* 176*  BUN 68* 66*  CREATININE 1.69* 1.74*  CALCIUM 8.3* 8.4    Recent Labs  06/20/13 1635  PTH 212.3*   Iron Studies: No results found for this basename: IRON, TIBC, TRANSFERRIN, FERRITIN,  in the last 72 hours  Studies/Results: US Renal  06-Jul-2013   CLINICAL DATA:  Acute renal insufficiency  EXAM: RENAL/URINARY TRACT ULTRASOUND COMPLETE  COMPARISON:  None.  FINDINGS: Right Kidney:  Length: 12.7 cm. Mild diffuse cortical thinning. No hydronephrosis or mass.  Left Kidney:  Length: 6.5 cm. Diffuse parenchymal volume loss. No mass or hydronephrosis visualized.  Bladder:  Not visualized.  IMPRESSION: 1. Atrophic left kidney. 2. Right renal cortical thinning/volume  loss.   Electronically Signed   By: Signa Kell M.D.   On: 07/06/13 11:50    I have reviewed the patient's current medications.  Assessment/Plan: 1 AKI/CKD  Adequate urine but still overloaded. Bp more stable.  Acid/base and K ok. Vol elevated but acceptable.  Cr seems to be  turning around.  Agree with conservative approach 2 MI with EF 15% and effusion per Card- again conservative approach.  If hemodynamics cont to be stable, we may be able to add on diuretics- but then again, pt may autodiurese as renal function improves.  3 Anxiety- prns for agitation.  4. dispo- seem to be trying medical management at this point, if stabilizes great, if not likely palliative approach     LOS: 3 days   Esmeralda Malay A 06/22/2013,12:12 PM

## 2013-06-22 NOTE — Progress Notes (Addendum)
Subjective: Patient crying with pain, morphine just administered approximately 5-10 minutes ago, very anxious, holding onto her upper abdomen, with some shortness of breath, family members present including husband, daughter and niece. Reaffirmed comfort care measures or least a nonaggressive approach, denies any intervention he acknowledges significant cardiovascular issues including recent MI. Minimal by mouth intake at this time. Patient did calm down after reassurance, speaking in longer sentences albeit still very anxious.  Objective: Vital signs in last 24 hours: Temp:  [96.3 F (35.7 C)-98.7 F (37.1 C)] 96.3 F (35.7 C) (12/25 1200) Pulse Rate:  [54-85] 74 (12/25 1200) Resp:  [11-27] 12 (12/25 1200) BP: (91-107)/(45-78) 106/54 mmHg (12/25 1200) SpO2:  [75 %-100 %] 100 % (12/25 1200) FiO2 (%):  [28 %-50 %] 50 % (12/25 0800) Weight:  [89 kg (196 lb 3.4 oz)] 89 kg (196 lb 3.4 oz) (12/25 0323) Weight change:  Last BM Date: 06/18/13  CBG (last 3)   Recent Labs  06/28/2013 2138 06/22/13 0733 06/22/13 1203  GLUCAP 165* 125* 137*    Intake/Output from previous day: 12/24 0701 - 12/25 0700 In: 360 [P.O.:120; I.V.:240] Out: 1135 [Urine:1135] Intake/Output this shift: Total I/O In: 405 [P.O.:355; I.V.:50] Out: 125 [Urine:125]  General appearance-morbidly obese, sitting up in bed, anxious, but able to follow commands Sclera anicteric, positive JVD, fluid buildup in the pre-clavicular areas bilaterally, no cervical lymphadenopathy trachea midline Rales at the bases, decreased breath sounds at the bases but no wheezing noted at this time Cardiovascular reveals regular rate and rhythm with murmur blowing appreciated Abdomen, soft, subjectively tender in the epigastric area, bowel sounds present Extremities reveal 1+ edema some cyanosis of the fingertips   Lab Results:  Recent Labs  06/12/2013 0358 06/22/13 0359 06/22/13 0400  NA 136 137 137  K 4.6 5.3* 5.2*  CL 100 103 103   CO2 24 25 25   GLUCOSE 162* 177* 176*  BUN 71* 68* 66*  CREATININE 2.06* 1.69* 1.74*  CALCIUM 8.4 8.3* 8.4  PHOS 4.6  --  4.7*    Recent Labs  2013/07/05 1633 06/16/2013 0358 06/22/13 0400  AST 40* 33  --   ALT 46* 41*  --   ALKPHOS 118* 113  --   BILITOT 0.8 0.7  --   PROT 6.7 6.4  --   ALBUMIN 3.1* 2.9* 2.7*    Recent Labs  06/05/2013 0358 06/22/13 0359  WBC 15.4* 13.1*  HGB 12.9 12.5  HCT 40.4 37.8  MCV 95.1 95.0  PLT 299 237    Recent Labs  06/20/13 0725 06/20/13 1323 06/04/2013 0358  TROPONINI 1.30* 1.07* 1.40*   No results found for this basename: TSH, T4TOTAL, FREET3, T3FREE, THYROIDAB,  in the last 72 hours No results found for this basename: VITAMINB12, FOLATE, FERRITIN, TIBC, IRON, RETICCTPCT,  in the last 72 hours  Studies/Results: US Renal  06/14/2013   CLINICAL DATA:  Acute renal insufficiency  EXAM: RENAL/URINARY TRACT ULTRASOUND COMPLETE  COMPARISON:  None.  FINDINGS: Right Kidney:  Length: 12.7 cm. Mild diffuse cortical thinning. No hydronephrosis or mass.  Left Kidney:  Length: 6.5 cm. Diffuse parenchymal volume loss. No mass or hydronephrosis visualized.  Bladder:  Not visualized.  IMPRESSION: 1. Atrophic left kidney. 2. Right renal cortical thinning/volume loss.   Electronically Signed   By: Signa Kell M.D.   On: 06/02/2013 11:50     Medications: Scheduled: . allopurinol  100 mg Oral Daily  . aspirin EC  81 mg Oral Daily  . atorvastatin  80 mg  Oral q1800  . diazepam  2.5 mg Oral QHS  . feeding supplement (ENSURE)  1 Container Oral TID BM  . gabapentin  300 mg Oral QHS  . insulin aspart  0-15 Units Subcutaneous TID WC  . insulin aspart  0-5 Units Subcutaneous QHS  . insulin aspart  3 Units Subcutaneous TID WC  . levothyroxine  88 mcg Oral QAC breakfast  . nitroGLYCERIN  0.1 mg Transdermal Daily  . nortriptyline  25 mg Oral QHS  . pantoprazole  40 mg Oral Daily   Continuous: . 0.9 % NaCl with KCl 20 mEq / L 1,000 mL (05/31/2013 2108)     Assessment/Plan: Principal Problem:   Dyspnea-multifactorial secondary to recent MI, significant systolic heart failure, with fluid back up, will continue diuresis as tolerated knowing that her cardiac function is significantly depressed, EF 15% with effusion, will hopefully tolerate increasing diuresis in the future however marginal blood pressure at this time with chronic kidney disease accompanied by acute renal insufficiency with by nephrology as well as cardiology, poor prognosis discussed with family, who are in agreement with giving this a trial of the next 1-2 days for potential improvement otherwise may need to consider palliative care. We'll consider diuretics if stable over the next 24 hours and blood pressure permits. Myocardial infarction-continue supportive measures including morphine and Ativan for anxiety Anxiety-we'll continue Ativan as needed Disposition-depends on any potential return of function over the next 24-48 hours otherwise need to consider comfort care and palliative care approach, family and agreement we'll continue supportive measures.    LOS: 3 days   Brynlyn Dade R 06/22/2013, 2:51 PM Given care plans do not include intubation or the use of pressor agents, will transfer to telemetry

## 2013-06-23 LAB — BASIC METABOLIC PANEL
BUN: 56 mg/dL — ABNORMAL HIGH (ref 6–23)
Calcium: 8.4 mg/dL (ref 8.4–10.5)
Chloride: 104 mEq/L (ref 96–112)
GFR calc Af Amer: 38 mL/min — ABNORMAL LOW (ref >90)
GFR calc non Af Amer: 33 mL/min — ABNORMAL LOW (ref >90)
Potassium: 4.8 mEq/L (ref 3.5–5.1)
Sodium: 140 mEq/L (ref 135–145)

## 2013-06-23 LAB — CBC
Hemoglobin: 12 g/dL (ref 12.0–15.0)
MCH: 30.1 pg (ref 26.0–34.0)
MCHC: 31.4 g/dL (ref 30.0–36.0)
Platelets: 228 10*3/uL (ref 150–400)

## 2013-06-23 LAB — GLUCOSE, CAPILLARY
Glucose-Capillary: 178 mg/dL — ABNORMAL HIGH (ref 70–99)
Glucose-Capillary: 162 mg/dL — ABNORMAL HIGH (ref 70–99)

## 2013-06-23 MED ORDER — METOPROLOL TARTRATE 12.5 MG HALF TABLET
12.5000 mg | ORAL_TABLET | Freq: Two times a day (BID) | ORAL | Status: DC
  Administered 2013-06-23 – 2013-06-28 (×12): 12.5 mg via ORAL
  Filled 2013-06-23 (×14): qty 1

## 2013-06-23 MED ORDER — CLOPIDOGREL BISULFATE 75 MG PO TABS
75.0000 mg | ORAL_TABLET | Freq: Every day | ORAL | Status: DC
  Administered 2013-06-24 – 2013-06-26 (×3): 75 mg via ORAL
  Filled 2013-06-23 (×5): qty 1

## 2013-06-23 NOTE — Progress Notes (Signed)
Subjective: She is up and down in terms of how she feels. Just required some morphine for some abd type pain . No bm in 4-5 days per daughter although she has been eating some.  Tried to sit up today. Only could do so for two minutes and then she was tired and had to lie down.  Objective: Vital signs in last 24 hours: Temp:  [97.4 F (36.3 C)-97.5 F (36.4 C)] 97.4 F (36.3 C) (12/26 1255) Pulse Rate:  [73-88] 88 (12/26 1255) Resp:  [13-18] 18 (12/26 1255) BP: (107-131)/(56-75) 131/75 mmHg (12/26 1255) SpO2:  [95 %-100 %] 95 % (12/26 1255) Weight:  [89.1 kg (196 lb 6.9 oz)] 89.1 kg (196 lb 6.9 oz) (12/25 2045) Weight change: 0.1 kg (3.5 oz) Last BM Date: 06/18/13  Intake/Output from previous day: 12/25 0701 - 12/26 0700 In: 705 [P.O.:475; I.V.:230] Out: 1075 [Urine:1075] Intake/Output this shift: Total I/O In: 383.3 [P.O.:240; I.V.:143.3] Out: 300 [Urine:300]  General appearance: alert, appears stated age and mild distress Resp: clear to auscultation bilaterally Cardio: regular rate and rhythm, S1, S2 normal, no murmur, click, rub or gallop GI: soft, non-tender; bowel sounds normal; no masses,  no organomegaly Extremities: extremities normal, atraumatic, no cyanosis or edema Neurologic: Grossly normal   Lab Results:  Recent Labs  06/22/13 0359 06/23/13 0510  WBC 13.1* 14.3*  HGB 12.5 12.0  HCT 37.8 38.2  PLT 237 228   BMET  Recent Labs  06/22/13 0400 06/23/13 0510  NA 137 140  K 5.2* 4.8  CL 103 104  CO2 25 26  GLUCOSE 176* 183*  BUN 66* 56*  CREATININE 1.74* 1.42*  CALCIUM 8.4 8.4   CMET CMP     Component Value Date/Time   NA 140 06/23/2013 0510   K 4.8 06/23/2013 0510   CL 104 06/23/2013 0510   CO2 26 06/23/2013 0510   GLUCOSE 183* 06/23/2013 0510   BUN 56* 06/23/2013 0510   CREATININE 1.42* 06/23/2013 0510   CALCIUM 8.4 06/23/2013 0510   PROT 6.4 07-16-2013 0358   ALBUMIN 2.7* 06/22/2013 0400   AST 33 July 16, 2013 0358   ALT 41* 2013-07-16  0358   ALKPHOS 113 07-16-13 0358   BILITOT 0.7 Jul 16, 2013 0358   GFRNONAA 33* 06/23/2013 0510   GFRAA 38* 06/23/2013 0510     Studies/Results: No results found.  Medications: I have reviewed the patient's current medications.  Marland Kitchen allopurinol  100 mg Oral Daily  . aspirin EC  81 mg Oral Daily  . atorvastatin  80 mg Oral q1800  . [START ON 06/24/2013] clopidogrel  75 mg Oral Q breakfast  . diazepam  2.5 mg Oral QHS  . feeding supplement (ENSURE)  1 Container Oral TID BM  . gabapentin  300 mg Oral QHS  . insulin aspart  0-15 Units Subcutaneous TID WC  . insulin aspart  0-5 Units Subcutaneous QHS  . insulin aspart  3 Units Subcutaneous TID WC  . levothyroxine  88 mcg Oral QAC breakfast  . metoprolol tartrate  12.5 mg Oral BID  . nitroGLYCERIN  0.1 mg Transdermal Daily  . nortriptyline  25 mg Oral QHS  . pantoprazole  40 mg Oral Daily     Assessment/Plan:  Principal Problem:   Dyspnea-s/p recent sig. MI with severely reduced  EF.  Cont. Medical therapy to see if she will improve. Active Problems:   Gait instability-try to mobilize if stronger.   Anxiety-prn meds.   SOB (shortness of breath)-severe.  She wants to go  home but i doubt she could be cared for at home now.  So, she may end up needing snf type care (i think this will be the outcome).   Peripheral vascular disease-follow.   LOS: 4 days   Ezequiel Kayser, MD 06/23/2013, 5:31 PM

## 2013-06-23 NOTE — Progress Notes (Signed)
Patient ID: Fabio Pierce, female   DOB: [birth year], 79 y.o.   MRN: [id number] S:Events over last 24 hours noted O:BP 114/56  Pulse 80  Temp(Src) 97.5 F (36.4 C) (Oral)  Resp 16  Ht 5' 6.5" (1.689 m)  Wt 89.1 kg (196 lb 6.9 oz)  BMI 31.23 kg/m2  SpO2 100%  Intake/Output Summary (Last 24 hours) at 06/23/13 1243 Last data filed at 06/23/13 0835  Gross per 24 hour  Intake    540 ml  Output    950 ml  Net   -410 ml   Intake/Output: I/O last 3 completed shifts: In: 825 [P.O.:475; I.V.:350] Out: 1500 [Urine:1500]  Intake/Output this shift:  Total I/O In: 240 [P.O.:240] Out: -  Weight change: 0.1 kg (3.5 oz)    Recent Labs Lab 07/15/2013 1633 06/20/13 0120 06/25/2013 0358 06/22/13 0359 06/22/13 0400 06/23/13 0510  NA 137 135 136 137 137 140  K 4.8 5.0 4.6 5.3* 5.2* 4.8  CL 101 100 100 103 103 104  CO2 24 24 24 25 25 26   GLUCOSE 178* 238* 162* 177* 176* 183*  BUN 57* 60* 71* 68* 66* 56*  CREATININE 1.82* 1.95* 2.06* 1.69* 1.74* 1.42*  ALBUMIN 3.1*  --  2.9*  --  2.7*  --   CALCIUM 9.0 8.6 8.4 8.3* 8.4 8.4  PHOS  --   --  4.6  --  4.7*  --   AST 40*  --  33  --   --   --   ALT 46*  --  41*  --   --   --    Liver Function Tests:  Recent Labs Lab 07/24/2013 1633 06/19/2013 0358 06/22/13 0400  AST 40* 33  --   ALT 46* 41*  --   ALKPHOS 118* 113  --   BILITOT 0.8 0.7  --   PROT 6.7 6.4  --   ALBUMIN 3.1* 2.9* 2.7*   No results found for this basename: LIPASE, AMYLASE,  in the last 168 hours No results found for this basename: AMMONIA,  in the last 168 hours CBC:  Recent Labs Lab 06/30/2013 1633 06/20/13 0120 06/25/2013 0358 06/22/13 0359 06/23/13 0510  WBC 12.4* 11.9* 15.4* 13.1* 14.3*  HGB 12.4 12.2 12.9 12.5 12.0  HCT 38.5 36.8 40.4 37.8 38.2  MCV 93.7 93.9 95.1 95.0 95.7  PLT 227 242 299 237 228   Cardiac Enzymes:  Recent Labs Lab June 29, 2013 1955 06/20/13 0120 06/20/13 0725 06/20/13 1323 06/03/2013 0358  TROPONINI 1.68* 1.68* 1.30* 1.07* 1.40*    CBG:  Recent Labs Lab 06/22/13 1203 06/22/13 1811 06/22/13 2210 06/23/13 0732 06/23/13 1123  GLUCAP 137* 105* 143* 150* 157*    Iron Studies: No results found for this basename: IRON, TIBC, TRANSFERRIN, FERRITIN,  in the last 72 hours Studies/Results: No results found. Marland Kitchen allopurinol  100 mg Oral Daily  . aspirin EC  81 mg Oral Daily  . atorvastatin  80 mg Oral q1800  . diazepam  2.5 mg Oral QHS  . feeding supplement (ENSURE)  1 Container Oral TID BM  . gabapentin  300 mg Oral QHS  . insulin aspart  0-15 Units Subcutaneous TID WC  . insulin aspart  0-5 Units Subcutaneous QHS  . insulin aspart  3 Units Subcutaneous TID WC  . levothyroxine  88 mcg Oral QAC breakfast  . nitroGLYCERIN  0.1 mg Transdermal Daily  . nortriptyline  25 mg Oral QHS  . pantoprazole  40 mg Oral Daily  BMET    Component Value Date/Time   NA 140 06/23/2013 0510   K 4.8 06/23/2013 0510   CL 104 06/23/2013 0510   CO2 26 06/23/2013 0510   GLUCOSE 183* 06/23/2013 0510   BUN 56* 06/23/2013 0510   CREATININE 1.42* 06/23/2013 0510   CALCIUM 8.4 06/23/2013 0510   GFRNONAA 33* 06/23/2013 0510   GFRAA 38* 06/23/2013 0510   CBC    Component Value Date/Time   WBC 14.3* 06/23/2013 0510   RBC 3.99 06/23/2013 0510   HGB 12.0 06/23/2013 0510   HCT 38.2 06/23/2013 0510   PLT 228 06/23/2013 0510   MCV 95.7 06/23/2013 0510   MCH 30.1 06/23/2013 0510   MCHC 31.4 06/23/2013 0510   RDW 14.4 06/23/2013 0510   LYMPHSABS 3.2 03/02/2012 1420   MONOABS 0.7 03/02/2012 1420   EOSABS 0.2 03/02/2012 1420   BASOSABS 0.0 03/02/2012 1420     Assessment/Plan:  1 AKI/CKD- Scr continues to improve.  Good UOP.  Bp more stable. Acid/base and K ok. Agree with conservative approach and have nothing further to add.  Will sign off. Please call with questions or concerns. 2 MI with EF 15% and effusion per Card- again conservative approach. If hemodynamics cont to be stable, we may be able to add on diuretics- but then again, pt  may autodiurese as renal function improves.  3 Anxiety- prns for agitation.  4. dispo- seem to be trying medical management at this point, if stabilizes great, if not likely palliative approach   Paymon Rosensteel A

## 2013-06-23 NOTE — Care Management Note (Addendum)
    Page 1 of 1   06/26/2013     2:30:07 PM   CARE MANAGEMENT NOTE 06/26/2013  Patient:  Marie Cannon, Marie Cannon   Account Number:  [id number]  Date Initiated:  06/20/2013  Documentation initiated by:  DAVIS,RHONDA  Subjective/Objective Assessment:   pt was seen in the [facility] for weakness and instability to stand dyspneic on arrival and sent for admission, ekg positive for nstemi and troponin elevated     Action/Plan:   home with husband when stable   Anticipated DC Date:  06/27/2013   Anticipated DC Plan:  [facility]OS[facility]ICE [facility]      DC Planning Services  CM consult      Choice offered to / List presented to:             Status of service:  In process, will continue to follow Medicare Important Message given?   (If response is "NO", the following Medicare IM given date fields will be blank) Date Medicare IM given:   Date Additional Medicare IM given:    Discharge Disposition:    Per UR Regulation:  Reviewed for med. necessity/level of care/duration of stay  If discussed at Long Length of Stay Meetings, dates discussed:    Comments:  06/26/13 Marie Lax RN,BSN NCM [phone] PALLIATIVE CONS TODAY.  06/13/13 Marie Heyden RN,BSN NCM [phone] WOULD RECOMMEND PT/OT EVAL.  [id number] Earlene Plater, RN, BSN, [place] [phone] Chart Reviewed for discharge and hospital needs. Discharge needs at time of review:  None present will follow for needs. Review of patient progress due on [id number].

## 2013-06-23 NOTE — Progress Notes (Signed)
Subjective:  Feels much better today, shortness of breath improved, no further abdominal discomfort.  Objective:  Vital Signs in the last 24 hours: Temp:  [97.5 F (36.4 C)-98 F (36.7 C)] 97.5 F (36.4 C) (12/26 0522) Pulse Rate:  [73-82] 80 (12/26 0522) Resp:  [13-18] 16 (12/26 0522) BP: (107-119)/(45-64) 114/56 mmHg (12/26 0522) SpO2:  [94 %-100 %] 100 % (12/26 0522) Weight:  [89.1 kg (196 lb 6.9 oz)] 89.1 kg (196 lb 6.9 oz) (12/25 2045)  Intake/Output from previous day: 12/25 0701 - 12/26 0700 In: 705 [P.O.:475; I.V.:230] Out: 1075 [Urine:1075]  Physical Exam:   General appearance: alert, cooperative, fatigued and mild distress Eyes: normal externally Neck: no adenopathy, no carotid bruit, supple, symmetrical, trachea midline, thyroid not enlarged, symmetric, no tenderness/mass/nodules and JVD noted. Neck: JVP - normal, carotids 2+= without bruits Resp: Bilateral halfway basis, tubular breath sounds, scattered rhonchi heard. Chest wall: no tenderness Cardio: regular rate and rhythm, S1, S2 normal, no murmur, click, rub or gallop GI: Mild ultimately, mild hepatic tenderness, otherwise no other organomegaly, soft belly, bowel sounds heard in all 4 quadrants. Extremities: extremities normal, atraumatic, no cyanosis or edema    Lab Results:  Recent Labs  06/22/13 0359 06/23/13 0510  WBC 13.1* 14.3*  HGB 12.5 12.0  PLT 237 228    Recent Labs  06/22/13 0400 06/23/13 0510  NA 137 140  K 5.2* 4.8  CL 103 104  CO2 25 26  GLUCOSE 176* 183*  BUN 66* 56*  CREATININE 1.74* 1.42*    Recent Labs  06/20/13 1323 2013-06-24 0358  TROPONINI 1.07* 1.40*   Hepatic Function Panel  Recent Labs  2013/06/24 0358 06/22/13 0400  PROT 6.4  --   ALBUMIN 2.9* 2.7*  AST 33  --   ALT 41*  --   ALKPHOS 113  --   BILITOT 0.7  --    Imaging: Imaging results have been reviewed   Assessment/Plan:  1. Subacute anterior myocardial infarction, with markedly reduced left  systolic function. Inferior infarct old. 2. Acute on chronic renal failure, improved. 3. Acute systolic heart failure, improving. 4. Hypoalbuminemia  Recommendation: I have discussed the findings of echocardiogram edema the patient and her family. At this point patient is presently doing well renal function is also improving. Would recommend addition of a small dose of beta blocker. I will also add Plavix to the present regimen. Otherwise no changes will be done today.  Recommendation:   Pamella Pert, M.D. 06/23/2013, 12:40 PM [facility] Pager: [phone] Office: [phone] If no answer: [phone]

## 2013-06-24 LAB — CBC
MCH: 30.1 pg (ref 26.0–34.0)
MCHC: 31.8 g/dL (ref 30.0–36.0)
MCV: 94.7 fL (ref 78.0–100.0)
Platelets: 251 10*3/uL (ref 150–400)
RBC: 4.15 MIL/uL (ref 3.87–5.11)
RDW: 14.5 % (ref 11.5–15.5)
WBC: 13.2 10*3/uL — ABNORMAL HIGH (ref 4.0–10.5)

## 2013-06-24 LAB — BASIC METABOLIC PANEL
BUN: 46 mg/dL — ABNORMAL HIGH (ref 6–23)
CO2: 26 mEq/L (ref 19–32)
Calcium: 8.6 mg/dL (ref 8.4–10.5)
Chloride: 97 mEq/L (ref 96–112)
Creatinine, Ser: 1.33 mg/dL — ABNORMAL HIGH (ref 0.50–1.10)
GFR calc non Af Amer: 35 mL/min — ABNORMAL LOW (ref >90)
Glucose, Bld: 123 mg/dL — ABNORMAL HIGH (ref 70–99)
Sodium: 133 mEq/L — ABNORMAL LOW (ref 135–145)

## 2013-06-24 MED ORDER — DOCUSATE SODIUM 100 MG PO CAPS
100.0000 mg | ORAL_CAPSULE | Freq: Two times a day (BID) | ORAL | Status: DC
  Administered 2013-06-24 – 2013-06-26 (×5): 100 mg via ORAL
  Filled 2013-06-24 (×6): qty 1

## 2013-06-24 MED ORDER — FUROSEMIDE 20 MG PO TABS
20.0000 mg | ORAL_TABLET | Freq: Every day | ORAL | Status: DC
  Administered 2013-06-24 – 2013-06-25 (×2): 20 mg via ORAL
  Filled 2013-06-24 (×2): qty 1

## 2013-06-24 MED ORDER — POLYETHYLENE GLYCOL 3350 17 G PO PACK
17.0000 g | PACK | Freq: Every day | ORAL | Status: DC
  Administered 2013-06-24 – 2013-06-26 (×3): 17 g via ORAL
  Filled 2013-06-24 (×3): qty 1

## 2013-06-24 NOTE — Progress Notes (Addendum)
Subjective: No big events. Still requiring periodic morphine.   No bm yet.  Comfortable at the moment.  Objective: Vital signs in last 24 hours: Temp:  [97.9 F (36.6 C)-98.2 F (36.8 C)] 97.9 F (36.6 C) (12/27 0401) Pulse Rate:  [76-78] 78 (12/27 0401) Resp:  [18] 18 (12/27 0401) BP: (110-115)/(70-75) 110/75 mmHg (12/27 0401) SpO2:  [95 %-99 %] 99 % (12/27 0401) Weight:  [90.4 kg (199 lb 4.7 oz)] 90.4 kg (199 lb 4.7 oz) (12/27 0401) Weight change: 1.3 kg (2 lb 13.9 oz) Last BM Date: 06/18/13  Intake/Output from previous day: 12/26 0701 - 12/27 0700 In: 663.3 [P.O.:440; I.V.:143.3] Out: 950 [Urine:950] Intake/Output this shift:    General appearance: alert and no distress Resp: clear to auscultation bilaterally Cardio: regular rate and rhythm, S1, S2 normal, no murmur, click, rub or gallop GI: soft, non-tender; bowel sounds normal; no masses,  no organomegaly Extremities: extremities normal, atraumatic, no cyanosis or edema Neurologic: Grossly normal   Lab Results:  Recent Labs  06/23/13 0510 06/24/13 0455  WBC 14.3* 13.2*  HGB 12.0 12.5  HCT 38.2 39.3  PLT 228 251   BMET  Recent Labs  06/23/13 0510 06/24/13 0455  NA 140 133*  K 4.8 4.7  CL 104 97  CO2 26 26  GLUCOSE 183* 123*  BUN 56* 46*  CREATININE 1.42* 1.33*  CALCIUM 8.4 8.6   CMET CMP     Component Value Date/Time   NA 133* 06/24/2013 0455   K 4.7 06/24/2013 0455   CL 97 06/24/2013 0455   CO2 26 06/24/2013 0455   GLUCOSE 123* 06/24/2013 0455   BUN 46* 06/24/2013 0455   CREATININE 1.33* 06/24/2013 0455   CALCIUM 8.6 06/24/2013 0455   PROT 6.4 07-21-2013 0358   ALBUMIN 2.7* 06/22/2013 0400   AST 33 July 21, 2013 0358   ALT 41* 07/21/13 0358   ALKPHOS 113 2013/07/21 0358   BILITOT 0.7 07-21-13 0358   GFRNONAA 35* 06/24/2013 0455   GFRAA 41* 06/24/2013 0455     Studies/Results: No results found.  Medications: I have reviewed the patient's current medications.  Marland Kitchen allopurinol   100 mg Oral Daily  . aspirin EC  81 mg Oral Daily  . atorvastatin  80 mg Oral q1800  . clopidogrel  75 mg Oral Q breakfast  . diazepam  2.5 mg Oral QHS  . docusate sodium  100 mg Oral BID  . feeding supplement (ENSURE)  1 Container Oral TID BM  . gabapentin  300 mg Oral QHS  . insulin aspart  0-5 Units Subcutaneous QHS  . levothyroxine  88 mcg Oral QAC breakfast  . metoprolol tartrate  12.5 mg Oral BID  . nitroGLYCERIN  0.1 mg Transdermal Daily  . nortriptyline  25 mg Oral QHS  . pantoprazole  40 mg Oral Daily  . polyethylene glycol  17 g Oral Daily   CBG (last 3)   Recent Labs  06/23/13 1759 06/23/13 2100 06/24/13 0759  GLUCAP 178* 162* 112*      Assessment/Plan:  Principal Problem:   Dyspnea-continue current meds.  S/p MI and now with reduced EF. Active Problems:   Gait instability-follow.   Anxiety-prn anxiolytics   SOB (shortness of breath)-see above   Peripheral vascular disease-follow. Constipation:  Add miralax and colace to her regimen particularly given the narcotic dosing she has needed. Insulin stopped and cbg checks stopped. No hx of diabetes and family requested that we back off on this.   LOS: 5 days  Ezequiel Kayser, MD 06/24/2013, 1:50 PM

## 2013-06-24 NOTE — Progress Notes (Signed)
Subjective:  Feels much better today, shortness of breath improved, no further abdominal discomfort. Had mild upper abdominal discomfort needing morphine sulphate. Family at bedside.   Objective:  Vital Signs in the last 24 hours: Temp:  [97.9 F (36.6 C)-98.2 F (36.8 C)] 98.1 F (36.7 C) (12/27 1443) Pulse Rate:  [76-79] 79 (12/27 1443) Resp:  [18] 18 (12/27 0401) BP: (110-115)/(70-75) 110/75 mmHg (12/27 1443) SpO2:  [95 %-99 %] 97 % (12/27 1443) Weight:  [90.4 kg (199 lb 4.7 oz)] 90.4 kg (199 lb 4.7 oz) (12/27 0401)  Intake/Output from previous day: 12/26 0701 - 12/27 0700 In: 663.3 [P.O.:440; I.V.:143.3] Out: 950 [Urine:950]  Physical Exam:   General appearance: alert, cooperative, fatigued and mild distress Eyes: normal externally Neck: no adenopathy, no carotid bruit, supple, symmetrical, trachea midline, thyroid not enlarged, symmetric, no tenderness/mass/nodules and JVD noted. Neck: JVP - normal, carotids 2+= without bruits Resp: Bilateral halfway basis, tubular breath sounds, scattered rhonchi heard. Chest wall: no tenderness Cardio: regular rate and rhythm, S1, S2 normal, no murmur, click, rub or gallop GI: Mild ultimately, mild hepatic tenderness, otherwise no other organomegaly, soft belly, bowel sounds heard in all 4 quadrants. Extremities: extremities normal, atraumatic, no cyanosis or edema  Scheduled Meds: . allopurinol  100 mg Oral Daily  . aspirin EC  81 mg Oral Daily  . atorvastatin  80 mg Oral q1800  . clopidogrel  75 mg Oral Q breakfast  . diazepam  2.5 mg Oral QHS  . docusate sodium  100 mg Oral BID  . feeding supplement (ENSURE)  1 Container Oral TID BM  . gabapentin  300 mg Oral QHS  . insulin aspart  0-5 Units Subcutaneous QHS  . levothyroxine  88 mcg Oral QAC breakfast  . metoprolol tartrate  12.5 mg Oral BID  . nitroGLYCERIN  0.1 mg Transdermal Daily  . nortriptyline  25 mg Oral QHS  . pantoprazole  40 mg Oral Daily  . polyethylene glycol  17  g Oral Daily   Continuous Infusions: . 0.9 % NaCl with KCl 20 mEq / L 20 mL/hr (06/23/13 0740)   PRN Meds:.acetaminophen, acetaminophen, alum & mag hydroxide-simeth, LORazepam, morphine injection, ondansetron (ZOFRAN) IV, ondansetron, zolpidem   Lab Results:  Recent Labs  06/23/13 0510 06/24/13 0455  WBC 14.3* 13.2*  HGB 12.0 12.5  PLT 228 251    Recent Labs  06/23/13 0510 06/24/13 0455  NA 140 133*  K 4.8 4.7  CL 104 97  CO2 26 26  GLUCOSE 183* 123*  BUN 56* 46*  CREATININE 1.42* 1.33*   No results found for this basename: TROPONINI, CK, MB,  in the last 72 hours Hepatic Function Panel  Recent Labs  06/22/13 0400  ALBUMIN 2.7*   Imaging: Imaging results have been reviewed   Assessment/Plan:  1. Subacute anterior myocardial infarction, with markedly reduced left systolic function. Inferior infarct old. 2. Acute on chronic renal failure, improved. 3. Acute systolic heart failure, improving. 4. Hypoalbuminemia.  Recommendation: Patient is presently doing well but extremely weak and unable to sit on the side of the bed or chair for greater than 20 minutes. I am not sure that her recovery would be fairly quick. Suspect she will need SNF.  I will add furosemide 20 mg by mouth daily to present regimen, for congestive heart failure. I will not start ACE inhibitors for now due to acute renal failure and renal insufficiency. I will add hydralazine and ISDN if blood pressure tolerates after starting furosemide. Consider  Fleet's enema if no bowel movement in the next 24 hours.    Pamella Pert, M.D. 06/24/2013, 3:02 PM [facility] Pager: [phone] Office: [phone] If no answer: [phone]

## 2013-06-25 ENCOUNTER — Inpatient Hospital Stay (HOSPITAL_COMMUNITY): Payer: Medicare Other

## 2013-06-25 LAB — CBC
Hemoglobin: 12.8 g/dL (ref 12.0–15.0)
MCH: 30.4 pg (ref 26.0–34.0)
MCHC: 32.5 g/dL (ref 30.0–36.0)
MCV: 93.6 fL (ref 78.0–100.0)
Platelets: 260 10*3/uL (ref 150–400)
RDW: 14.5 % (ref 11.5–15.5)

## 2013-06-25 LAB — BASIC METABOLIC PANEL
BUN: 40 mg/dL — ABNORMAL HIGH (ref 6–23)
Calcium: 8.5 mg/dL (ref 8.4–10.5)
GFR calc Af Amer: 48 mL/min — ABNORMAL LOW (ref >90)
GFR calc non Af Amer: 41 mL/min — ABNORMAL LOW (ref >90)
Glucose, Bld: 223 mg/dL — ABNORMAL HIGH (ref 70–99)

## 2013-06-25 MED ORDER — ENSURE PUDDING PO PUDG
1.0000 | Freq: Three times a day (TID) | ORAL | Status: DC
  Administered 2013-06-26 – 2013-06-28 (×7): 1 via ORAL
  Filled 2013-06-25 (×14): qty 1

## 2013-06-25 MED ORDER — FUROSEMIDE 40 MG PO TABS
40.0000 mg | ORAL_TABLET | Freq: Every day | ORAL | Status: DC
  Administered 2013-06-26: 40 mg via ORAL
  Filled 2013-06-25: qty 1

## 2013-06-25 MED ORDER — SODIUM CHLORIDE 0.9 % IJ SOLN
3.0000 mL | Freq: Two times a day (BID) | INTRAMUSCULAR | Status: DC
  Administered 2013-06-26 – 2013-06-28 (×3): 3 mL via INTRAVENOUS

## 2013-06-25 MED ORDER — FUROSEMIDE 10 MG/ML IJ SOLN
40.0000 mg | Freq: Once | INTRAMUSCULAR | Status: AC
  Administered 2013-06-25: 40 mg via INTRAVENOUS
  Filled 2013-06-25: qty 4

## 2013-06-25 NOTE — Progress Notes (Signed)
Subjective: She is doing a bit worse today.  Breathing is more labored.  Breath sounds are a bit more rattle like.  She is eating a very small amount. Still no bm reported.  She required morphine and ativan earlier today.  When she coughs she has pain and bit more distress.  Objective: Vital signs in last 24 hours: Temp:  [97.6 F (36.4 C)-98.8 F (37.1 C)] 98.2 F (36.8 C) (12/28 1417) Pulse Rate:  [80-88] 81 (12/28 1417) Resp:  [20] 20 (12/28 1417) BP: (110-131)/(69-83) 121/81 mmHg (12/28 1417) SpO2:  [94 %-99 %] 95 % (12/28 1417) Weight:  [92.5 kg (203 lb 14.8 oz)] 92.5 kg (203 lb 14.8 oz) (12/28 0436) Weight change: 2.1 kg (4 lb 10.1 oz) Last BM Date: 06/18/13  Intake/Output from previous day: 12/27 0701 - 12/28 0700 In: 1170.3 [P.O.:600; I.V.:570.3] Out: 1250 [Urine:1250] Intake/Output this shift:    General appearance: very weak, semi-supine. fatigued but she is responding to questions appropriately. Resp: decreased bs anterolaterally. she is too weak to even sit her up for posterior lung examination Cardio: regular rate and rhythm, S1, S2 normal, no murmur, click, rub or gallop GI: soft, non-tender; bowel sounds normal; no masses,  no organomegaly Extremities: extremities normal, atraumatic, no cyanosis or edema Neurologic: Grossly normal   Lab Results:  Recent Labs  06/24/13 0455 06/25/13 0545  WBC 13.2* 12.0*  HGB 12.5 12.8  HCT 39.3 39.4  PLT 251 260   BMET  Recent Labs  06/24/13 0455 06/25/13 0545  NA 133* 134*  K 4.7 5.2*  CL 97 101  CO2 26 26  GLUCOSE 123* 223*  BUN 46* 40*  CREATININE 1.33* 1.17*  CALCIUM 8.6 8.5   CMET CMP     Component Value Date/Time   NA 134* 06/25/2013 0545   K 5.2* 06/25/2013 0545   CL 101 06/25/2013 0545   CO2 26 06/25/2013 0545   GLUCOSE 223* 06/25/2013 0545   BUN 40* 06/25/2013 0545   CREATININE 1.17* 06/25/2013 0545   CALCIUM 8.5 06/25/2013 0545   PROT 6.4 27-Jun-2013 0358   ALBUMIN 2.7* 06/22/2013 0400   AST 33 2013-06-27 0358   ALT 41* 06/27/13 0358   ALKPHOS 113 June 27, 2013 0358   BILITOT 0.7 06-27-13 0358   GFRNONAA 41* 06/25/2013 0545   GFRAA 48* 06/25/2013 0545     Studies/Results: No results found.  Medications: I have reviewed the patient's current medications.  Marland Kitchen allopurinol  100 mg Oral Daily  . aspirin EC  81 mg Oral Daily  . atorvastatin  80 mg Oral q1800  . clopidogrel  75 mg Oral Q breakfast  . diazepam  2.5 mg Oral QHS  . docusate sodium  100 mg Oral BID  . feeding supplement (ENSURE)  1 Container Oral TID BM  . furosemide  20 mg Oral Daily  . insulin aspart  0-5 Units Subcutaneous QHS  . levothyroxine  88 mcg Oral QAC breakfast  . metoprolol tartrate  12.5 mg Oral BID  . nitroGLYCERIN  0.1 mg Transdermal Daily  . nortriptyline  25 mg Oral QHS  . pantoprazole  40 mg Oral Daily  . polyethylene glycol  17 g Oral Daily  . sodium chloride  3 mL Intravenous Q12H     Assessment/Plan:  Principal Problem:   Dyspnea, she is status post large MI in the last few weeks. She has severe chf.  She is very weak.  She looks worse today.  Bun/cr have improved a bit but respiratory status  seems worse.  I will saline lock iv, check portable xray chest, i will give a one time dose of iv lasix 40 mg today and inc. Daily lasix to 40 mg po daily. The family will continue to encourage oral intake.  For her somnolence i am stopping the gabapentin.   Active Problems:   Gait instability-too weak to walk now.   Anxiety-follow.   SOB (shortness of breath)-see above.   Peripheral vascular disease-follow.   LOS: 6 days   Ezequiel Kayser, MD 06/25/2013, 2:47 PM

## 2013-06-26 DIAGNOSIS — N179 Acute kidney failure, unspecified: Secondary | ICD-10-CM

## 2013-06-26 DIAGNOSIS — I509 Heart failure, unspecified: Secondary | ICD-10-CM

## 2013-06-26 DIAGNOSIS — Z515 Encounter for palliative care: Secondary | ICD-10-CM

## 2013-06-26 DIAGNOSIS — J90 Pleural effusion, not elsewhere classified: Secondary | ICD-10-CM | POA: Diagnosis present

## 2013-06-26 DIAGNOSIS — I2510 Atherosclerotic heart disease of native coronary artery without angina pectoris: Secondary | ICD-10-CM

## 2013-06-26 DIAGNOSIS — I5021 Acute systolic (congestive) heart failure: Secondary | ICD-10-CM | POA: Diagnosis present

## 2013-06-26 LAB — BASIC METABOLIC PANEL
Calcium: 8.4 mg/dL (ref 8.4–10.5)
Creatinine, Ser: 1.33 mg/dL — ABNORMAL HIGH (ref 0.50–1.10)
GFR calc Af Amer: 41 mL/min — ABNORMAL LOW (ref >90)
GFR calc non Af Amer: 35 mL/min — ABNORMAL LOW (ref >90)
Glucose, Bld: 159 mg/dL — ABNORMAL HIGH (ref 70–99)
Potassium: 4.5 mEq/L (ref 3.5–5.1)
Sodium: 135 mEq/L (ref 135–145)

## 2013-06-26 LAB — CBC
MCH: 30.3 pg (ref 26.0–34.0)
MCV: 93.4 fL (ref 78.0–100.0)
Platelets: 245 10*3/uL (ref 150–400)
RBC: 4.26 MIL/uL (ref 3.87–5.11)
WBC: 13.6 10*3/uL — ABNORMAL HIGH (ref 4.0–10.5)

## 2013-06-26 MED ORDER — BISACODYL 10 MG RE SUPP: 10.0000 mg | Freq: Every day | RECTAL | Status: DC | PRN

## 2013-06-26 MED ORDER — LORAZEPAM 2 MG/ML IJ SOLN
1.0000 mg | INTRAMUSCULAR | Status: DC | PRN
  Administered 2013-06-26 – 2013-06-28 (×7): 1 mg via INTRAVENOUS
  Filled 2013-06-26 (×7): qty 1

## 2013-06-26 MED ORDER — FUROSEMIDE 40 MG PO TABS
40.0000 mg | ORAL_TABLET | Freq: Two times a day (BID) | ORAL | Status: DC
  Filled 2013-06-26 (×2): qty 1

## 2013-06-26 MED ORDER — MORPHINE SULFATE 2 MG/ML IJ SOLN
1.0000 mg | INTRAMUSCULAR | Status: DC | PRN
  Administered 2013-06-26 – 2013-06-28 (×13): 1 mg via INTRAVENOUS
  Filled 2013-06-26 (×14): qty 1

## 2013-06-26 NOTE — Progress Notes (Signed)
Subjective: Continues to be dyspneic at rest.  Morphine helps some.  Upper abdominal tightness, fullness, pain controlled with Morphine.  Discussed conversations that daughter had with Dr. Jacinto Halim and goals of care with daughter and patient.  Daughter is leaning towards comfort care.  Palliative care family meeting planned for this afternoon.    Objective: Vital signs in last 24 hours: Temp:  [97.7 F (36.5 C)-98.2 F (36.8 C)] 97.8 F (36.6 C) (12/29 0542) Pulse Rate:  [81-87] 87 (12/29 0542) Resp:  [19-20] 19 (12/29 0542) BP: (121-123)/(72-81) 123/72 mmHg (12/29 0542) SpO2:  [95 %-97 %] 97 % (12/29 0542) Weight:  [92.1 kg (203 lb 0.7 oz)] 92.1 kg (203 lb 0.7 oz) (12/29 0542) Weight change: -0.4 kg (-14.1 oz) Last BM Date: 06/18/13  CBG (last 3)   Recent Labs  06/23/13 1759 06/23/13 2100 06/24/13 0759  GLUCAP 178* 162* 112*    Intake/Output from previous day: 12/28 0701 - 12/29 0700 In: 240 [P.O.:240] Out: 2050 [Urine:2050] Intake/Output this shift: Total I/O In: 480 [P.O.:480] Out: -   General appearance: drowsy, mild tachypnea, no apparent pain or distress Eyes: no scleral icterus Throat: oropharynx moist without erythema Resp: decreased breath sounds bilateral bases with crackles throughout both lung fields Cardio: regular rate and rhythm GI: distended with normal bowel sounds; mild tenderness Extremities:1+ edema  Lab Results:  Recent Labs  06/25/13 0545 06/26/13 0537  NA 134* 135  K 5.2* 4.5  CL 101 99  CO2 26 29  GLUCOSE 223* 159*  BUN 40* 36*  CREATININE 1.17* 1.33*  CALCIUM 8.5 8.4   No results found for this basename: AST, ALT, ALKPHOS, BILITOT, PROT, ALBUMIN,  in the last 72 hours  Recent Labs  06/25/13 0545 06/26/13 0537  WBC 12.0* 13.6*  HGB 12.8 12.9  HCT 39.4 39.8  MCV 93.6 93.4  PLT 260 245   No results found for this basename: INR, PROTIME   No results found for this basename: CKTOTAL, CKMB, CKMBINDEX, TROPONINI,  in the last 72  hours No results found for this basename: TSH, T4TOTAL, FREET3, T3FREE, THYROIDAB,  in the last 72 hours No results found for this basename: VITAMINB12, FOLATE, FERRITIN, TIBC, IRON, RETICCTPCT,  in the last 72 hours  Studies/Results: Dg Chest Port 1 View  06/25/2013   CLINICAL DATA:  Short of breath.  Chest pain.  EXAM: PORTABLE CHEST - 1 VIEW  COMPARISON:  06/27/2013  FINDINGS: Bilateral lower lung zone opacity obscures hemidiaphragms and most of the heart borders. This is likely combination of pleural effusions with either atelectasis or infiltrate. There is central vascular congestion. No upper lobe cephalization is seen. Finding still may reflect mild congestive failure.  No pneumothorax.  The bony thorax is demineralized but intact.  IMPRESSION: Bilateral lung base opacity likely combination of pleural effusions with either atelectasis or infiltrate. This has increased from the prior study. Mild central vascular congestion. Findings may be due to mild congestive heart failure.   Electronically Signed   By: Amie Portland M.D.   On: 06/25/2013 16:16     Medications: Scheduled: . allopurinol  100 mg Oral Daily  . aspirin EC  81 mg Oral Daily  . atorvastatin  80 mg Oral q1800  . clopidogrel  75 mg Oral Q breakfast  . diazepam  2.5 mg Oral QHS  . docusate sodium  100 mg Oral BID  . feeding supplement (ENSURE)  1 Container Oral TID WC  . furosemide  40 mg Oral Daily  . levothyroxine  88 mcg Oral QAC breakfast  . metoprolol tartrate  12.5 mg Oral BID  . nitroGLYCERIN  0.1 mg Transdermal Daily  . nortriptyline  25 mg Oral QHS  . pantoprazole  40 mg Oral Daily  . polyethylene glycol  17 g Oral Daily  . sodium chloride  3 mL Intravenous Q12H   Continuous:   Assessment/Plan: Principal Problem: 1. Dyspnea secondary to acute systolic congestive heart failure (EF 15%)- minimal improvement with diuresis (~1810 net output in 24 hrs).  Remains volume overloaded- will try to push dose of Lasix to  BID to help with symptoms management but limited by renal failure.  Unable to tolerate ACE inhibitor due to ARF.  On low dose BBlocker.  Continue MSO4 as needed for dyspnea.  Active Problems: 2. Pleural effusion- secondary to CHF.  Continue diuresis. 3. Coronary artery disease with history of myocardial infarction without history of CABG- continue medical therapy for now.  Recent MI culprit. 4. Acute Renal Failure- stable.  Monitor with diuresis. 5. Abdominal Pain- abdominal distention is likely due to volume overload.  Continue MSO4 as needed and attempts at diuresis. 6. Anxiety- continue Ativan as needed. 7. Ethics- discussed goals with daughter- considering end-of life comfort care which is appropriate given lack of response to medical therapy.  Palliative care consult today. 8. Disposition- [facility] may be an option.  Daughter does not feel that she could return home even if Hospice involved.   LOS: 7 days   Marie Cannon,W Marie Cannon 06/26/2013, 10:25 AM

## 2013-06-26 NOTE — Consult Note (Signed)
Patient ZO:XWRU A Munch      DOB: 1926/04/05      EAV:[id number]     Consult Note from the Palliative Medicine Team at [facility]    Consult Requested by: Dr. Jacinto Halim    PCP: Garlan Fillers, MD Reason for Consultation: Clarification GOC and options Phone Number:[phone]  Assessment of patients Current state: We met with Marie Cannon family today about her goals of care. We met with her husband Marie Cannon of 38 yrs, daughter Marie Cannon [phone]), son Marie Cannon, and Don's wife Marie Cannon. The family was very clear that Marie Cannon has told them she does not want extraordinary measures and would not want her life prolonged if her prognosis is poor. They understand that she has EF 15% and that her heart function is a very limiting factor for her and her kidneys have not been able to tolerate Lasix well. They understand that she will not recover to baseline and are concerned about her quality of life. They want her to be comfortable and not to suffer. Mr. Mccorry stated several times throughout our conversation that he wanted her comfortable and not to suffer and doesn't want her suffering to be prolonged. They are all on board for comfort measures and hospice involvement. They agree to limit her medical interventions (no more labdraws, IVs, IV fluids, CBG sticks) and focus just on keeping her comfortable as this is what she said she wanted.   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 She worked at [employer] occasionally a few times. Her and her husband enjoy a condo down in [place] that they planned on visiting last week but wound up in the hospital instead. Her family says that she is "fiesty" and full of life and would not want to live like this. She has been adamant in the past about never being put in a nursing home. Marie Cannon's mother was in hospice care a year ago and they are familiar with the goals and good care that hospice can provide them and this helped them make this decision today. We will  continue to support Marie Cannon and her family holistically.    Goals of Care: 1.  Code Status: DNR   2. Scope of Treatment: 1. Vital Signs: daily 2. Respiratory/Oxygen: for comfort 3. Nutritional Support/Tube Feeds: comfort feeds only 4. IVF: no 5. Review of Medications to be discontinued: Ambien, Aspirin, Lipitor, Protonix, Plavix, Miralax, Colace, Lasix, Allopurinol, Valium, Pamelor. 6. Labs: no 7. Telemetry: no 8. Consults: Social work, spiritual care   4. Disposition: Family hopeful for residential hospice.   3. Symptom Management:   1. Anxiety/Agitation: Ativan prn. 2. Pain: Morphine prn. 3. Dyspnea: Will order Lasix IV if needed. 4. Bowel Regimen: Dulcolax prn. 5. Fever: Tylenol prn. 6. Nausea/Vomiting: Ondansetron prn.  4. Psychosocial: Emotional support provided to patient and family while difficult discussions and decisions being made.  5. Spiritual: Spiritual care consult placed.    Patient Documents Completed or Given: Document Given Completed  Advanced Directives Pkt    MOST    DNR    Gone from My Sight    Hard Choices yes     Brief HPI: 79 yo female recent MI and heart failure.    ROS: Unable to elicit - pt confused.    PMH:  Past Medical History  Diagnosis Date  . Gait instability   . Insomnia   . Anxiety   . IBS (irritable bowel syndrome)   . Diverticulosis   .  Shortness of breath   . Heart murmur   . Peripheral vascular disease   . Arthritis   . Dyslipidemia   . Peripheral neuropathy      PSH: Past Surgical History  Procedure Laterality Date  . Cholecystectomy    . Cataract extraction    . Tah and bso    . Abdominal hysterectomy     I have reviewed the FH and SH and  If appropriate update it with new information. Allergies  Allergen Reactions  . Codeine Nausea And Vomiting   Scheduled Meds: . feeding supplement (ENSURE)  1 Container Oral TID WC  . levothyroxine  88 mcg Oral QAC breakfast  . metoprolol tartrate  12.5  mg Oral BID  . nitroGLYCERIN  0.1 mg Transdermal Daily  . sodium chloride  3 mL Intravenous Q12H   Continuous Infusions:  PRN Meds:.acetaminophen, acetaminophen, alum & mag hydroxide-simeth, LORazepam, morphine injection, ondansetron (ZOFRAN) IV, ondansetron    BP 121/71  Pulse 92  Temp(Src) 97.8 F (36.6 C) (Oral)  Resp 19  Ht 5' 6.5" (1.689 m)  Wt 92.1 kg (203 lb 0.7 oz)  BMI 32.28 kg/m2  SpO2 97%   PPS: 30%   Intake/Output Summary (Last 24 hours) at 06/26/13 1436 Last data filed at 06/26/13 1055  Gross per 24 hour  Intake    960 ml  Output   1650 ml  Net   -690 ml   LBM: 06/18/13                        Physical Exam:  General: NAD, ill appearing HEENT:  [place]/AT, no JVD Chest: Coarse in bases, no use of accessory muscle, symmetric breathes CVS: RRR, S1 S2 Abdomen: Slightly distended and tender, +BS Ext: BLE 1+ edema, cool to touch Neuro: Confused, nonverbal except moaning  Labs: CBC    Component Value Date/Time   WBC 13.6* 06/26/2013 0537   RBC 4.26 06/26/2013 0537   HGB 12.9 06/26/2013 0537   HCT 39.8 06/26/2013 0537   PLT 245 06/26/2013 0537   MCV 93.4 06/26/2013 0537   MCH 30.3 06/26/2013 0537   MCHC 32.4 06/26/2013 0537   RDW 14.4 06/26/2013 0537   LYMPHSABS 3.2 03/02/2012 1420   MONOABS 0.7 03/02/2012 1420   EOSABS 0.2 03/02/2012 1420   BASOSABS 0.0 03/02/2012 1420    BMET    Component Value Date/Time   NA 135 06/26/2013 0537   K 4.5 06/26/2013 0537   CL 99 06/26/2013 0537   CO2 29 06/26/2013 0537   GLUCOSE 159* 06/26/2013 0537   BUN 36* 06/26/2013 0537   CREATININE 1.33* 06/26/2013 0537   CALCIUM 8.4 06/26/2013 0537   GFRNONAA 35* 06/26/2013 0537   GFRAA 41* 06/26/2013 0537    CMP     Component Value Date/Time   NA 135 06/26/2013 0537   K 4.5 06/26/2013 0537   CL 99 06/26/2013 0537   CO2 29 06/26/2013 0537   GLUCOSE 159* 06/26/2013 0537   BUN 36* 06/26/2013 0537   CREATININE 1.33* 06/26/2013 0537   CALCIUM 8.4 06/26/2013 0537   PROT  6.4 07/21/2013 0358   ALBUMIN 2.7* 06/22/2013 0400   AST 33 June 29, 2013 0358   ALT 41* Jun 29, 2013 0358   ALKPHOS 113 07/16/2013 0358   BILITOT 0.7 07/16/2013 0358   GFRNONAA 35* 06/26/2013 0537   GFRAA 41* 06/26/2013 0537     Time In Time Out Total Time Spent with Patient Total Overall Time  1320 1450      Greater than 50%  of this time was spent counseling and coordinating care related to the above assessment and plan.  Yong Channel, NP Palliative Medicine Team Team Phone # [phone]  Discussed with Dr. Link Snuffer.

## 2013-06-26 NOTE — Progress Notes (Signed)
Noticed pocket of fluid over neck area, notified Dr. Jacinto Halim who is at the bedside and he assessed patient. Patient in bed resting, no distress noted.

## 2013-06-26 NOTE — Progress Notes (Addendum)
Subjective:   Patient received morphine sulfate this morning around 7:00 in the morning, was complaining of mild upper abdominal discomfort, very poor intake per nursing. Patient unable to sit on the side of the bed. Patient easily arousable and responds appropriately.  Objective:  Vital Signs in the last 24 hours: Temp:  [97.7 F (36.5 C)-98.2 F (36.8 C)] 97.8 F (36.6 C) (12/29 0542) Pulse Rate:  [81-87] 87 (12/29 0542) Resp:  [19-20] 19 (12/29 0542) BP: (121-131)/(72-83) 123/72 mmHg (12/29 0542) SpO2:  [95 %-97 %] 97 % (12/29 0542) Weight:  [92.1 kg (203 lb 0.7 oz)] 92.1 kg (203 lb 0.7 oz) (12/29 0542)  Intake/Output from previous day: 12/28 0701 - 12/29 0700 In: 240 [P.O.:240] Out: 2050 [Urine:2050]  Physical Exam: General appearance: cooperative, fatigued, mild distress and slowed mentation, arousable and answers appropriately.  Eyes: normal externally Neck: no adenopathy, no carotid bruit, supple, symmetrical, trachea midline, thyroid not enlarged, symmetric, no tenderness/mass/nodules and JVD noted. suprascapular puffiness noted. Neck: JVP - cannot be determined, carotids 2+= without bruits. Resp: Bilateral halfway basis, tubular breath sounds, scattered rhonchi heard. Chest wall: no tenderness Cardio: regular rate and rhythm, S1, S2 normal, no murmur, click, rub or gallop GI: Mild ultimately, mild hepatic tenderness, otherwise no other organomegaly, soft belly, bowel sounds heard in all 4 quadrants. Extremities: extremities normal, atraumatic, no cyanosis or edema  Scheduled Meds: . allopurinol  100 mg Oral Daily  . aspirin EC  81 mg Oral Daily  . atorvastatin  80 mg Oral q1800  . clopidogrel  75 mg Oral Q breakfast  . diazepam  2.5 mg Oral QHS  . docusate sodium  100 mg Oral BID  . feeding supplement (ENSURE)  1 Container Oral TID WC  . furosemide  40 mg Oral Daily  . levothyroxine  88 mcg Oral QAC breakfast  . metoprolol tartrate  12.5 mg Oral BID  . nitroGLYCERIN   0.1 mg Transdermal Daily  . nortriptyline  25 mg Oral QHS  . pantoprazole  40 mg Oral Daily  . polyethylene glycol  17 g Oral Daily  . sodium chloride  3 mL Intravenous Q12H   Continuous Infusions:   PRN Meds:.acetaminophen, acetaminophen, alum & mag hydroxide-simeth, LORazepam, morphine injection, ondansetron (ZOFRAN) IV, ondansetron, zolpidem   Lab Results:  Recent Labs  06/25/13 0545 06/26/13 0537  WBC 12.0* 13.6*  HGB 12.8 12.9  PLT 260 245    Recent Labs  06/25/13 0545 06/26/13 0537  NA 134* 135  K 5.2* 4.5  CL 101 99  CO2 26 29  GLUCOSE 223* 159*  BUN 40* 36*  CREATININE 1.17* 1.33*   Assessment/Plan:  1. Subacute anterior myocardial infarction, with markedly reduced left systolic function. Inferior infarct old. 2. Acute on chronic renal failure, improved. 3. Acute systolic heart failure, no significant change, chest x-ray findings noted, worsening of bilateral basilar infiltrates and pulmonary congestion. Patient tachypneic with poor respiratory effort. Poor cough. 4. Hypoalbuminemia. 5. failure to thrive.  Recommendation:  I had a long discussion with the patient's daughter, Wakisha Alberts regarding very poor outcome, consideration for hospice/palliative care as I do not think that she will improve. Comfort measures with discontinuation of medications and starting morphine sulfate for pain will be appropriate at this time. I do not think that she'll survive this hospitalization.  Patient's daughter expressed the fact that we're expecting this outcome, but were hoping that should improve. I discussed with Dr. Jarome Matin this morning over the telephone and he is in agreement,  states that patient recently has been talking about end-of-life.  I did not make any changes to her medications, but patient's family clearly understands poor outcome and I be available if further assistance is needed with regard to her care. I will continue to follow her if CODE STATUS is  not changed. I will discuss with primary team to see if they would be agreeable to this approach.  If patient is made comfort care, would discontinue all the medications from cardiac standpoint. Patient's family is aware of this. I will place a hospice consult on chart and await final impression.  Pamella Pert, M.D. 06/26/2013, 8:22 AM [facility] Pager: [phone] Office: [phone] If no answer: [phone]

## 2013-06-26 NOTE — Progress Notes (Signed)
Thank you for consulting the Palliative Medicine Team at [facility] to meet your patient's and family's needs.   The reason that you asked [place] to see your patient is for goals of care.  We have scheduled your patient for a meeting: 12/29 1330  The Surrogate decision make is: Marie Cannon (daughter) Contact information: [phone]  Other family members that need to be present: [name] to notify and coordinate for 1330 12/29    Your patient is able/unable to participate: no-confused  Yong Channel, NP Palliative Medicine Team Team Phone # [phone]

## 2013-06-27 DIAGNOSIS — R0609 Other forms of dyspnea: Secondary | ICD-10-CM

## 2013-06-27 DIAGNOSIS — F411 Generalized anxiety disorder: Secondary | ICD-10-CM

## 2013-06-27 DIAGNOSIS — E1149 Type 2 diabetes mellitus with other diabetic neurological complication: Secondary | ICD-10-CM

## 2013-06-27 MED ORDER — VITAMINS A & D EX OINT
TOPICAL_OINTMENT | CUTANEOUS | Status: AC
  Administered 2013-06-27: 1
  Filled 2013-06-27: qty 5

## 2013-06-27 MED ORDER — HALOPERIDOL LACTATE 5 MG/ML IJ SOLN
1.0000 mg | Freq: Four times a day (QID) | INTRAMUSCULAR | Status: DC | PRN
  Administered 2013-06-28: 21:00:00 1 mg via INTRAVENOUS
  Filled 2013-06-27: qty 1

## 2013-06-27 MED ORDER — MAGNESIUM HYDROXIDE 400 MG/5ML PO SUSP
30.0000 mL | Freq: Every day | ORAL | Status: DC
  Administered 2013-06-27 – 2013-06-28 (×2): 30 mL via ORAL
  Filled 2013-06-27 (×2): qty 30

## 2013-06-27 NOTE — Progress Notes (Signed)
Progress Note from the Palliative Medicine Team at [facility]  Subjective: Marie Cannon is intermittently alert. She appears comfortable and is smiling when awake. Her son Marie Cannon, daughter Marie Cannon, and granddaughter Marie Cannon are at bedside. She is smiling, winking, and jovial when interacting with her family. They seem pleased with the move to comfort and are hopeful for transition to [facility]. They are happy with her pain and anxiety control although nursing reported one episode of screaming from Marie Cannon but was relieved by Ativan. Her Morphine and Ativan are keeping her comfortable right now. Nursing says that her Ativan has been helping her most. I gave the family a copy of Gone From My Sight.    Objective: Allergies  Allergen Reactions  . Codeine Nausea And Vomiting   Scheduled Meds: . feeding supplement (ENSURE)  1 Container Oral TID WC  . levothyroxine  88 mcg Oral QAC breakfast  . magnesium hydroxide  30 mL Oral Daily  . metoprolol tartrate  12.5 mg Oral BID  . nitroGLYCERIN  0.1 mg Transdermal Daily  . sodium chloride  3 mL Intravenous Q12H   Continuous Infusions:  PRN Meds:.acetaminophen, acetaminophen, alum & mag hydroxide-simeth, bisacodyl, haloperidol lactate, LORazepam, morphine injection, ondansetron (ZOFRAN) IV, ondansetron  BP 131/80  Pulse 98  Temp(Src) 97.9 F (36.6 C) (Oral)  Resp 18  Ht 5' 6.5" (1.689 m)  Wt 94.3 kg (207 lb 14.3 oz)  BMI 33.06 kg/m2  SpO2 97%   PPS: 20%  Pain Score: denies   Intake/Output Summary (Last 24 hours) at 06/27/13 1133 Last data filed at 06/27/13 0600  Gross per 24 hour  Intake    360 ml  Output   1600 ml  Net  -1240 ml      LBM: 06/20/13      Physical Exam:  General: NAD, intermittently alert, lethargic HEENT: [place]/AT, no JVD Chest: Coarse in bases - better than yesterday, no use of accessory muscles, symmetric breathes CVS: RRR, S1 S2 Abdomen: Slightly distended, NT, +BS Ext: BLE 1+ edema, warm to touch Neuro: Confused,  verbal today  Labs: CBC    Component Value Date/Time   WBC 13.6* 06/26/2013 0537   RBC 4.26 06/26/2013 0537   HGB 12.9 06/26/2013 0537   HCT 39.8 06/26/2013 0537   PLT 245 06/26/2013 0537   MCV 93.4 06/26/2013 0537   MCH 30.3 06/26/2013 0537   MCHC 32.4 06/26/2013 0537   RDW 14.4 06/26/2013 0537   LYMPHSABS 3.2 03/02/2012 1420   MONOABS 0.7 03/02/2012 1420   EOSABS 0.2 03/02/2012 1420   BASOSABS 0.0 03/02/2012 1420    BMET    Component Value Date/Time   NA 135 06/26/2013 0537   K 4.5 06/26/2013 0537   CL 99 06/26/2013 0537   CO2 29 06/26/2013 0537   GLUCOSE 159* 06/26/2013 0537   BUN 36* 06/26/2013 0537   CREATININE 1.33* 06/26/2013 0537   CALCIUM 8.4 06/26/2013 0537   GFRNONAA 35* 06/26/2013 0537   GFRAA 41* 06/26/2013 0537    CMP     Component Value Date/Time   NA 135 06/26/2013 0537   K 4.5 06/26/2013 0537   CL 99 06/26/2013 0537   CO2 29 06/26/2013 0537   GLUCOSE 159* 06/26/2013 0537   BUN 36* 06/26/2013 0537   CREATININE 1.33* 06/26/2013 0537   CALCIUM 8.4 06/26/2013 0537   PROT 6.4 07/21/2013 0358   ALBUMIN 2.7* 06/22/2013 0400   AST 33 07-21-2013 0358   ALT 41* 07-21-13 0358   ALKPHOS 113  05/30/2013 0358   BILITOT 0.7 06/20/2013 0358   GFRNONAA 35* 06/26/2013 0537   GFRAA 41* 06/26/2013 0537      Assessment and Plan: 1. Code Status: DNR 2. Symptom Control: 1. Pain: Morphine prn. 2. Anxiety: Ativan prn. Working well per nursing and family. Dyspnea improved as well with Ativan.  3. Bowel Regimen: Milk of mag daily. Dulcolax supp prn available if needed and unable to take po. 3. Psycho/Social: Emotional support provided to patient and family (son Marie Cannon, daughter Marie Cannon, and granddaughter Marie Cannon in room today).  4. Spiritual: Chaplain following. 5. Disposition: Hopeful for residential hospice.  Patient Documents Completed or Given: Document Given Completed  Advanced Directives Pkt    MOST    DNR    Gone from My Sight yes   Hard Choices      Time  In Time Out Total Time Spent with Patient Total Overall Time  1100 1135     Greater than 50%  of this time was spent counseling and coordinating care related to the above assessment and plan.  Yong Channel, NP Palliative Medicine Team Team Phone # [phone]   1

## 2013-06-27 NOTE — Progress Notes (Signed)
Patient XB:JYNW A Pargas      DOB: September 04, 1925      GNF:[id number]  Called by SW regarding patient's change in symptom management needs. We are working toward residential hospice.  Called nurse to get an update on change in status .  Nursing sitting outside room and in the background I can hear the patient perseverating loudly. Patient has been given morphine q hourly for what is perceived as pain related agitation but the patient states she is not in pain and she his not short of breath at the present time.  She did not receive any ativan as ordered.  I have asked the nurse to give her the ativan , and I will add low dose haldol for treatment of what sounds like agitated delirium.  We will see the patient as soon as possible in person.   Kalem Rockwell L. Ladona Ridgel, MD MBA The Palliative Medicine Team at [facility] Phone: [phone] Pager: [phone]

## 2013-06-27 NOTE — Progress Notes (Signed)
Night Chaplain visited with family and provided support. Day chaplain will continue to follow and support.

## 2013-06-27 NOTE — Consult Note (Signed)
[facility] Liaison: Received request from CSW this morning for family interest in [facility] Chart being reviewed. No additional availability today. Patients for admission tomorrow currently being reviewed. Will update CSW re availability changes. Thank you. Forrestine Him LCSW [phone]

## 2013-06-27 NOTE — Progress Notes (Addendum)
Patient was awake, and somewhat disjointed in her verbal and body language control due to her morphine dosage, which the nurse shared as every hour.  Present were the patent's son, daughter, niece, daughter-in-law, husband of 43 years and granddaughter.  The decision for comfort care had been made earlier in the afternoon.  Per nurse, the patient's heart is functioning at 15% capacity.  Spent time with the patient who hadn't lost her sense of humor, and with the morphine was a lot "freer" with it.  Chplain commended family on their choice and its "difficulty (i.e. Comfort care) and acknowledged what it means.  The family was enjoying the patient's comments, and reminiscing as they went with some "goading" by the Chaplain as he inquired to what sort of women she must have been.  He opened the door for the granddaughter to give her voice, and pointed out how this event has occurred on her school break of 3+ weeks, as a "sign" of a greater presence in all this.  Spoke to the son privately about death arrangements and the potential progression of the dying process.  He voiced that he had hoped she would pass during the college break of his daughter, given she was to be dying anyway.  (It was not as "cold" as it sounds in print.)  Very pleasant family group.  The husband is 30, and recently went blind from macular degeneration.  The son voiced concern over plans for him when the patient dies. Chaplain suggested he ask for Social Work assistance and guidance in exploring some choices for [employer].  The patient was very receptive to prayer.  Rema Jasmine, Chaplain Pager: [phone]

## 2013-06-27 NOTE — Progress Notes (Signed)
Clinical Social Work Department BRIEF PSYCHOSOCIAL ASSESSMENT 06/27/2013  Patient:  MEE, MACDONNELL     Account Number:  [id number]     Admit date:  2013-07-10  Clinical Social Worker:  Hattie Perch  Date/Time:  06/27/2013 12:00 M  Referred by:  Physician  Date Referred:  06/27/2013 Referred for  Residential hospice placement   Other Referral:   Interview type:  Family Other interview type:    PSYCHOSOCIAL DATA Living Status:  FAMILY Admitted from facility:   Level of care:   Primary support name:  Meryl Crutch Primary support relationship to patient:  SPOUSE Degree of support available:   good    CURRENT CONCERNS Current Concerns  Post-Acute Placement   Other Concerns:    SOCIAL WORK ASSESSMENT / PLAN Patient is unable to participate in assessment due to mental status. CSW called patient's spouse, Chrissie Noa. He confirms that there was a goals of care meeting yesterday and his family and him have discussed and are all agreeable that they would like patient over at [facility].   Assessment/plan status:   Other assessment/ plan:   Information/referral to community resources:    PATIENT'S/FAMILY'S RESPONSE TO PLAN OF CARE: spouse is realistic about patient's prognosis and appears to be handling it well but he spent time reminiscing with CSW and told several stories of their life together. CSW offered emotional support and supportive listening. referral made to [facility].

## 2013-06-27 NOTE — Progress Notes (Signed)
Subjective: Today she is a bit lethargic, but appears comfortable, is having some constipation  Objective: Vital signs in last 24 hours: Temp:  [97.9 F (36.6 C)-98.5 F (36.9 C)] 97.9 F (36.6 C) (12/30 0500) Pulse Rate:  [85-100] 98 (12/30 0500) Resp:  [18] 18 (12/30 0500) BP: (116-131)/(65-80) 131/80 mmHg (12/30 0500) SpO2:  [96 %-98 %] 97 % (12/30 0500) Weight:  [94.3 kg (207 lb 14.3 oz)] 94.3 kg (207 lb 14.3 oz) (12/30 0500) Weight change: 2.2 kg (4 lb 13.6 oz)   Intake/Output from previous day: 12/29 0701 - 12/30 0700 In: 1080 [P.O.:1080] Out: 1600 [Urine:1600]   General appearance: no distress and lethargic and alternates between asleep and awake, no dyspnea on oxygen Resp: bibasilar crackles Cardio: regular rate and rhythm GI: soft, non-tender; bowel sounds normal; no masses,  no organomegaly Extremities: bilateral 1+ leg and arm edema  Lab Results:  Recent Labs  06/25/13 0545 06/26/13 0537  WBC 12.0* 13.6*  HGB 12.8 12.9  HCT 39.4 39.8  PLT 260 245   BMET  Recent Labs  06/25/13 0545 06/26/13 0537  NA 134* 135  K 5.2* 4.5  CL 101 99  CO2 26 29  GLUCOSE 223* 159*  BUN 40* 36*  CREATININE 1.17* 1.33*  CALCIUM 8.5 8.4   CMET CMP     Component Value Date/Time   NA 135 06/26/2013 0537   K 4.5 06/26/2013 0537   CL 99 06/26/2013 0537   CO2 29 06/26/2013 0537   GLUCOSE 159* 06/26/2013 0537   BUN 36* 06/26/2013 0537   CREATININE 1.33* 06/26/2013 0537   CALCIUM 8.4 06/26/2013 0537   PROT 6.4 06/11/2013 0358   ALBUMIN 2.7* 06/22/2013 0400   AST 33 06/14/2013 0358   ALT 41* 06/07/2013 0358   ALKPHOS 113 06/12/2013 0358   BILITOT 0.7 05/29/2013 0358   GFRNONAA 35* 06/26/2013 0537   GFRAA 41* 06/26/2013 0537    CBG (last 3)  No results found for this basename: GLUCAP,  in the last 72 hours  INR RESULTS:   No results found for this basename: INR, PROTIME     Studies/Results: Dg Chest Port 1 View  06/25/2013   CLINICAL DATA:  Short of  breath.  Chest pain.  EXAM: PORTABLE CHEST - 1 VIEW  COMPARISON:  07-13-13  FINDINGS: Bilateral lower lung zone opacity obscures hemidiaphragms and most of the heart borders. This is likely combination of pleural effusions with either atelectasis or infiltrate. There is central vascular congestion. No upper lobe cephalization is seen. Finding still may reflect mild congestive failure.  No pneumothorax.  The bony thorax is demineralized but intact.  IMPRESSION: Bilateral lung base opacity likely combination of pleural effusions with either atelectasis or infiltrate. This has increased from the prior study. Mild central vascular congestion. Findings may be due to mild congestive heart failure.   Electronically Signed   By: Amie Portland M.D.   On: 06/25/2013 16:16    Medications: I have reviewed the patient's current medications.  Assessment/Plan: #1 CHF:  Slowly declining with poor food and fluid intake, agree with plans for transfer to inpatient hospice care tomorrow. #2 Hypotension: improved with adjustment of her meds.  #3 Diabetes mellitus, type 2: stable and will discontinue SSI    LOS: 8 days   Marie Cannon G 06/27/2013, 9:54 AM

## 2013-06-27 NOTE — Progress Notes (Signed)
Agree with the previous RN's assessment. Will continue to monitor patient.Setzer, Corrie Reder Marie  

## 2013-06-28 DIAGNOSIS — R52 Pain, unspecified: Secondary | ICD-10-CM

## 2013-06-28 MED ORDER — SODIUM CHLORIDE 0.9 % IV SOLN
2.0000 mg/h | INTRAVENOUS | Status: DC
  Administered 2013-06-28: 14:00:00 2 mg/h via INTRAVENOUS
  Filled 2013-06-28: qty 10

## 2013-06-28 MED ORDER — MORPHINE SULFATE 2 MG/ML IJ SOLN
1.0000 mg | INTRAMUSCULAR | Status: AC
  Administered 2013-06-28: 1 mg via INTRAVENOUS
  Filled 2013-06-28: qty 1

## 2013-06-28 MED ORDER — LORAZEPAM 2 MG/ML IJ SOLN
1.0000 mg | Freq: Four times a day (QID) | INTRAMUSCULAR | Status: DC
  Administered 2013-06-28 – 2013-06-29 (×3): 1 mg via INTRAVENOUS
  Filled 2013-06-28 (×3): qty 1

## 2013-06-28 NOTE — Progress Notes (Signed)
Patient, daughter and patient's spouse in room at time of patient. Patient would awaken and cough and then drowse again.  Daughter talked about how hopeful they are for a bed to open with Hospice. Listening; presence; comfort; prayer.

## 2013-06-28 NOTE — Progress Notes (Signed)
Progress Note from the Palliative Medicine Team at [facility]  Subjective: Ms. Marie Cannon is yelling, moaning, grimacing today when I walk in the room. She is coughing and in distress. Nursing gave her Morphine and Ativan prn with fair relief. I spoke with her family and we will add a continuous Morphine infusion and scheduled Ativan. She seems to be having pain and anxiety at this time. Nursing can titrate the Morphine to comfort and relief of symptoms. I left my card on the bedside table and asked the family to please call if she is still having symptoms (they know nursing can increase her infusion as well). She appears more comfortable after the prn medication but not fully relieved. They are awaiting a bed at [facility].   Objective: Allergies  Allergen Reactions  . Codeine Nausea And Vomiting   Scheduled Meds: . feeding supplement (ENSURE)  1 Container Oral TID WC  . levothyroxine  88 mcg Oral QAC breakfast  . LORazepam  1 mg Intravenous Q6H  . magnesium hydroxide  30 mL Oral Daily  . metoprolol tartrate  12.5 mg Oral BID  .  morphine injection  1 mg Intravenous NOW  . nitroGLYCERIN  0.1 mg Transdermal Daily  . sodium chloride  3 mL Intravenous Q12H   Continuous Infusions: . morphine     PRN Meds:.acetaminophen, acetaminophen, alum & mag hydroxide-simeth, bisacodyl, haloperidol lactate, ondansetron (ZOFRAN) IV, ondansetron  BP 138/81  Pulse 98  Temp(Src) 98.2 F (36.8 C) (Oral)  Resp 18  Ht 5' 6.5" (1.689 m)  Wt 94.7 kg (208 lb 12.4 oz)  BMI 33.20 kg/m2  SpO2 98%   PPS: 30% at best  Pain Score: nonverbal Pain Location: unknown   Intake/Output Summary (Last 24 hours) at 06/28/13 1334 Last data filed at 06/28/13 0900  Gross per 24 hour  Intake    480 ml  Output    875 ml  Net   -395 ml      LBM: 06/27/13   Physical Exam:  General: In distress, coughing, screaming out HEENT:  [place]/AT, grimacing, face reddening with coughing Chest: Tachypneic, labored breathing - prn  meds helping CVS: RRR Abdomen: Soft, NT, ND, +BS Ext: MAE, BLE cool to touch and weak pulses, feet mottled Neuro: Unable to follow commands  Labs: CBC    Component Value Date/Time   WBC 13.6* 06/26/2013 0537   RBC 4.26 06/26/2013 0537   HGB 12.9 06/26/2013 0537   HCT 39.8 06/26/2013 0537   PLT 245 06/26/2013 0537   MCV 93.4 06/26/2013 0537   MCH 30.3 06/26/2013 0537   MCHC 32.4 06/26/2013 0537   RDW 14.4 06/26/2013 0537   LYMPHSABS 3.2 03/02/2012 1420   MONOABS 0.7 03/02/2012 1420   EOSABS 0.2 03/02/2012 1420   BASOSABS 0.0 03/02/2012 1420    BMET    Component Value Date/Time   NA 135 06/26/2013 0537   K 4.5 06/26/2013 0537   CL 99 06/26/2013 0537   CO2 29 06/26/2013 0537   GLUCOSE 159* 06/26/2013 0537   BUN 36* 06/26/2013 0537   CREATININE 1.33* 06/26/2013 0537   CALCIUM 8.4 06/26/2013 0537   GFRNONAA 35* 06/26/2013 0537   GFRAA 41* 06/26/2013 0537    CMP     Component Value Date/Time   NA 135 06/26/2013 0537   K 4.5 06/26/2013 0537   CL 99 06/26/2013 0537   CO2 29 06/26/2013 0537   GLUCOSE 159* 06/26/2013 0537   BUN 36* 06/26/2013 0537   CREATININE 1.33* 06/26/2013  0537   CALCIUM 8.4 06/26/2013 0537   PROT 6.4 06/14/2013 0358   ALBUMIN 2.7* 06/22/2013 0400   AST 33 06/03/2013 0358   ALT 41* 06/05/2013 0358   ALKPHOS 113 06/03/2013 0358   BILITOT 0.7 06/04/2013 0358   GFRNONAA 35* 06/26/2013 0537   GFRAA 41* 06/26/2013 0537     Assessment and Plan: 1. Code Status: DNR 2. Symptom Control: 1. Pain/Dyspnea: Morphine infusion initiate 2mg /hr and titrate to MAX 10mg /hr for comfort. 2. Anxiety/Agitation: Ativan 1mg  scheduled q6h. 3. Psycho/Social: Emotional support provided to patient and patient's family. 4. Spiritual: Chaplain following. 5. Disposition: Hopeful for residential hospice and awaiting [facility].    Time In Time Out Total Time Spent with Patient Total Overall Time  1325 1410     Greater than 50%  of this time was spent  counseling and coordinating care related to the above assessment and plan.  Yong Channel, NP Palliative Medicine Team Team Phone # [phone]    1

## 2013-06-28 NOTE — Progress Notes (Signed)
Subjective: More alert today and ate some ensure pudding  Objective: Vital signs in last 24 hours: Temp:  [98.1 F (36.7 C)-99 F (37.2 C)] 98.2 F (36.8 C) (12/31 0426) Pulse Rate:  [87-98] 98 (12/31 0426) Resp:  [18-20] 18 (12/31 0426) BP: (109-122)/(72-82) 110/78 mmHg (12/31 0426) SpO2:  [94 %-98 %] 98 % (12/31 0426) Weight:  [94.7 kg (208 lb 12.4 oz)] 94.7 kg (208 lb 12.4 oz) (12/31 0426) Weight change: 0.4 kg (14.1 oz)   Intake/Output from previous day: 12/30 0701 - 12/31 0700 In: 240 [P.O.:240] Out: 1225 [Urine:1225]   General appearance: alert, cooperative and no distress Resp: bibasilar crackles Cardio: regular rate and rhythm GI: soft, non-tender; bowel sounds normal; no masses,  no organomegaly Extremities: bilateral 1+ leg edema  Lab Results:  Recent Labs  06/26/13 0537  WBC 13.6*  HGB 12.9  HCT 39.8  PLT 245   BMET  Recent Labs  06/26/13 0537  NA 135  K 4.5  CL 99  CO2 29  GLUCOSE 159*  BUN 36*  CREATININE 1.33*  CALCIUM 8.4   CMET CMP     Component Value Date/Time   NA 135 06/26/2013 0537   K 4.5 06/26/2013 0537   CL 99 06/26/2013 0537   CO2 29 06/26/2013 0537   GLUCOSE 159* 06/26/2013 0537   BUN 36* 06/26/2013 0537   CREATININE 1.33* 06/26/2013 0537   CALCIUM 8.4 06/26/2013 0537   PROT 6.4 July 19, 2013 0358   ALBUMIN 2.7* 06/22/2013 0400   AST 33 2013-07-19 0358   ALT 41* 2013/07/19 0358   ALKPHOS 113 2013/07/19 0358   BILITOT 0.7 July 19, 2013 0358   GFRNONAA 35* 06/26/2013 0537   GFRAA 41* 06/26/2013 0537    CBG (last 3)  No results found for this basename: GLUCAP,  in the last 72 hours  INR RESULTS:   No results found for this basename: INR, PROTIME     Studies/Results: No results found.  Medications: I have reviewed the patient's current medications.  Assessment/Plan: #1 CHF:  Clinically stable with end stage ischemic cardiomyopathy and acute CHF from systolic dysfunction. Awaiting bed availability at the [facility] facility. #2 Protein Calorie Malnutrition: stable with supplements   LOS: 9 days   Victory Dresden G 06/28/2013, 9:33 AM

## 2013-06-28 NOTE — Progress Notes (Signed)
No beds at [facility] currently. Discussed with family and MD.  Toma Copier C. Kaesen Rodriguez MSW, LCSW [phone]

## 2013-06-29 MED ORDER — MORPHINE BOLUS VIA INFUSION
1.0000 mg | INTRAVENOUS | Status: DC
  Filled 2013-06-29: qty 1

## 2013-06-29 MED ORDER — SCOPOLAMINE 1 MG/3DAYS TD PT72
1.0000 | MEDICATED_PATCH | TRANSDERMAL | Status: DC
Start: 2013-06-29 — End: 2013-06-29
  Filled 2013-06-29: qty 1

## 2013-06-29 MED ORDER — MORPHINE BOLUS VIA INFUSION
1.0000 mg | INTRAVENOUS | Status: DC | PRN
  Administered 2013-06-29: 1 mg via INTRAVENOUS
  Filled 2013-06-29: qty 1

## 2013-06-29 MED ORDER — MORPHINE SULFATE (CONCENTRATE) 10 MG /0.5 ML PO SOLN: 10.0000 mg | ORAL | Status: DC | PRN

## 2013-06-29 MED ORDER — LORAZEPAM 2 MG/ML IJ SOLN: 1.0000 mg | Freq: Four times a day (QID) | INTRAMUSCULAR | Status: DC | PRN

## 2013-06-29 MED ORDER — MORPHINE SULFATE (CONCENTRATE) 10 MG /0.5 ML PO SOLN: 5.0000 mg | ORAL | Status: DC | PRN

## 2013-06-29 MED ORDER — ATROPINE SULFATE 1 % OP SOLN
4.0000 [drp] | OPHTHALMIC | Status: DC | PRN
  Administered 2013-06-29: 16:00:00 4 [drp] via SUBLINGUAL
  Filled 2013-06-29: qty 2

## 2013-06-29 DEATH — deceased

## 2013-06-30 NOTE — Progress Notes (Signed)
I have reviewed this case with our NP and agree with the Assessment and Plan as stated.  Lealand Elting L. Annslee Tercero, MD MBA The Palliative Medicine Team at [facility] Team Phone: [phone] Pager: [phone]   

## 2013-06-30 NOTE — Consult Note (Signed)
I have reviewed this case with my NP and agree with the assessment and plan.  Rhanda Lemire L. Ladona Ridgelaylor, MD MBA The Palliative Medicine Team at [facility] Team Phone: [phone] Pager: [phone]

## 2013-07-01 NOTE — Progress Notes (Signed)
I have reviewed and discussed the care of this patient in detail with the nurse practitioner including pertinent patient records, physical exam findings and data. I agree with details of this encounter.  

## 2013-07-30 NOTE — Discharge Summary (Signed)
  DEATH SUMMARY:  Chief Complaint: I feel weak and short of breath HPI:   The patient was an 80 year old woman who had several medical problems who presented to our office on 06/05/2013 with a complaint of gradually worsening dyspnea over the past week. She also complained of weakness and difficulty standing up, rhinitis, dry cough, sore throat, and bilateral leg edema. She had a history of atypical chest discomfort and in 2003 had a Cardiolite exercise test that showed no ischemia with an echocardiogram that showed mild mitral regurgitation and mild aortic insufficiency. Recently she's had the above symptoms without fever, chills, or productive cough or chest discomfort. She presented to our office looking pale, weak, with dyspnea at rest, and she was unable to stand, so she was evaluated and transferred to the hospital for direct admission. She had no known history of coronary artery disease or DVT or pulmonary embolism. She had problems with intermittent dyspnea on exertion that led to a workup in September of 2013 that included: CT angiogram of the chest that showed no PE or primary lung disease, 9/13 Echocardiogram that showed normal LV systolic function with mild LVH and normal wall motion, LV Efx 55-60% with mild MR and mild TR and mild pulmonary HTN (PAP 30.7), 9/13 Lexiscan with normal wall motion and EFx 65%, EKG with NSR with inferior MI, old anterior infarct, IRBBB, low voltage.   She was admitted to an intensive care unit step-down bed due to what appeared to be severe congestive heart failure. Her initial workup was significant for a markedly elevated BNP test as well as an increased troponin I level, with EKG showing new Q waves across the precordium not present one year ago. Further workup showed acute renal insufficiency with hypotension, and an echocardiogram showed an ischemic dilated cardiomyopathy with left ventricular ejection fraction of 15%. She was followed closely by several consultants  from cardiology and nephrology. She was very clear about her request for no heroic measures, and she declined dialysis or cardiac catheterization. Medications were adjusted as well as possible given her relative hypotension. Her condition continued to slowly decline and she and her family opted for comfort measures with a plan for eventual [facility]. She was followed closely by palliative care personnel as well. He was treated with IV morphine and IV benzodiazepines with significant improvement in her level of comfort. On the day of her death she had an increase in her respiratory distress and her morphine dose was increased and atropine was added to help with  pulmonary secretions. She died comfortably with multiple members of her family present. An autopsy was not requested by her family or by physicians on her care team.  CAUSES OF DEATH:  1. congestive heart failure, acute, from systolic dysfunction  2. ischemic dilated cardiomyopathy  3. recent acute myocardial infarction, coronary artery disease  4. Dyslipidemia  5. Hypertension  6. acute renal insufficiency   DISPOSITION:  Nursing staff pronounced her death, and she was transported to the hospital morgue for further disposition.  Anureet Bruington G. M.D. 07/13/2013 17:20 HOURS

## 2013-07-30 NOTE — Progress Notes (Signed)
Upon entering the unit to see another patient I was called to Ms. Eagleton room where she had increased shortness of breath and was tachypneic with increased terminal secretions. Morphine bolus was delivered and infusion increased to ease dyspnea and pain. We also administered atropine SL for her secretions and I also used gentle oral suction. Her fingers appear mottled and her jaw muscles more relaxed - changes from a few hours ago. She appeared comfortable after the interventions. I explained to the family that she could pass any time now. They appreciated my honesty and within the next hour she passed. I provided emotional support to the family and they contacted their pastor. They did not need chaplain services from the hospital. The death was pronounced by two nurses on the unit.   Yong ChannelAlicia Leif Loflin, NP Palliative Medicine Team Team Phone # [phone]

## 2013-07-30 NOTE — Progress Notes (Signed)
Patient lethargic this morning. BP down. Morphine titrated back down to 2mg /hr. Will continue to monitor the patient.

## 2013-07-30 NOTE — Progress Notes (Signed)
Pt pronounced dead on 07/13/2013 at 1600 by this RN and Dellie BurnsJennifer Huff, RN

## 2013-07-30 NOTE — Progress Notes (Signed)
Subjective: Asleep and comfortable this morning, required morphine continuous IV last night due to cough, pain, and dyspnea.  Objective: Vital signs in last 24 hours: Temp:  [98 F (36.7 C)-98.4 F (36.9 C)] 98.4 F (36.9 C) (01/01 0528) Pulse Rate:  [84-98] 84 (01/01 0502) Resp:  [18-20] 20 (01/01 0502) BP: (102-138)/(66-102) 102/66 mmHg (01/01 0502) SpO2:  [95 %-97 %] 95 % (01/01 0502) Weight:  [93.2 kg (205 lb 7.5 oz)] 93.2 kg (205 lb 7.5 oz) (01/01 0502) Weight change: -1.5 kg (-3 lb 4.9 oz)   Intake/Output from previous day: 12/31 0701 - 01/01 0700 In: 457.1 [P.O.:420; I.V.:37.1] Out: 450 [Urine:450]   General appearance: no distress and asleep at 45 degree elevation of the head of bed Resp: bibasilar crackles Cardio: regular rate and rhythm GI: soft, non-tender; bowel sounds normal; no masses,  no organomegaly  Lab Results: No results found for this basename: WBC, HGB, HCT, PLT,  in the last 72 hours BMET No results found for this basename: NA, K, CL, CO2, GLUCOSE, BUN, CREATININE, CALCIUM,  in the last 72 hours CMET CMP     Component Value Date/Time   NA 135 06/26/2013 0537   K 4.5 06/26/2013 0537   CL 99 06/26/2013 0537   CO2 29 06/26/2013 0537   GLUCOSE 159* 06/26/2013 0537   BUN 36* 06/26/2013 0537   CREATININE 1.33* 06/26/2013 0537   CALCIUM 8.4 06/26/2013 0537   PROT 6.4 06/05/2013 0358   ALBUMIN 2.7* 06/22/2013 0400   AST 33 06/13/2013 0358   ALT 41* 06/26/2013 0358   ALKPHOS 113 06/07/2013 0358   BILITOT 0.7 06/07/2013 0358   GFRNONAA 35* 06/26/2013 0537   GFRAA 41* 06/26/2013 0537    CBG (last 3)  No results found for this basename: GLUCAP,  in the last 72 hours  INR RESULTS:   No results found for this basename: INR, PROTIME     Studies/Results: No results found.  Medications: I have reviewed the patient's current medications.  Assessment/Plan: #1 CHF: slowly declining and ate very little food yesterday afternoon and evening. Will  [facility] when a bed is available. #2 CAD: stable on current meds.  #3 Protein Calorie Malnutrition: likely from cardiomyopathy and hepatic congestion, and slowly worsening.   LOS: 10 days   Melinda Gwinner G 07/28/2013, 9:28 AM

## 2013-07-30 NOTE — Progress Notes (Addendum)
Morphine drip titrated to 3mg /hr last night. Patient breathing better now and not screaming out. Also, last night patient was having difficulty swallowing her pills and drink. Will continue to monitor.

## 2013-07-30 NOTE — Progress Notes (Signed)
Ms. Marie Cannon appears much more comfortable this morning. The Morphine infusion is at 2mg /hr and is keeping her comfortable. She is sleeping without dyspnea, cough, tachypnea and her face is relaxed and not tense or grimaced. Her husband, daughter, and daughter in law are at the bedside and are pleased and appreciative for the care she has received now that her symptoms are better managed. They are very happy with the nursing care there on the unit. They are [facility].  Yong ChannelAlicia Mairyn Lenahan, NP Palliative Medicine Team Team Phone # [phone]

## 2013-07-30 DEATH — deceased
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
## 2015-06-07 IMAGING — US US RENAL
1 series · 14 of 18 positions shown · non-contrast
Comparison: None.

CLINICAL DATA: Acute renal insufficiency

EXAM:
RENAL/URINARY TRACT ULTRASOUND COMPLETE

[Series 1: us renal · 0.22mm/px · 14 of 18 slices shown]
[im 1/18]
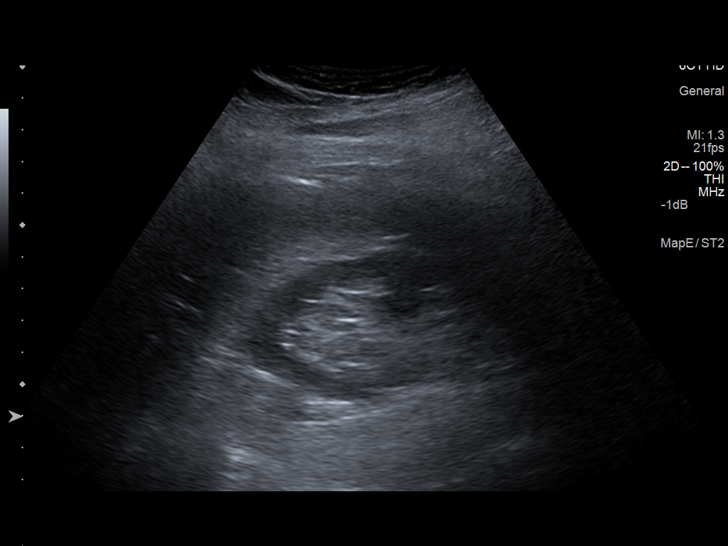
[im 2/18]
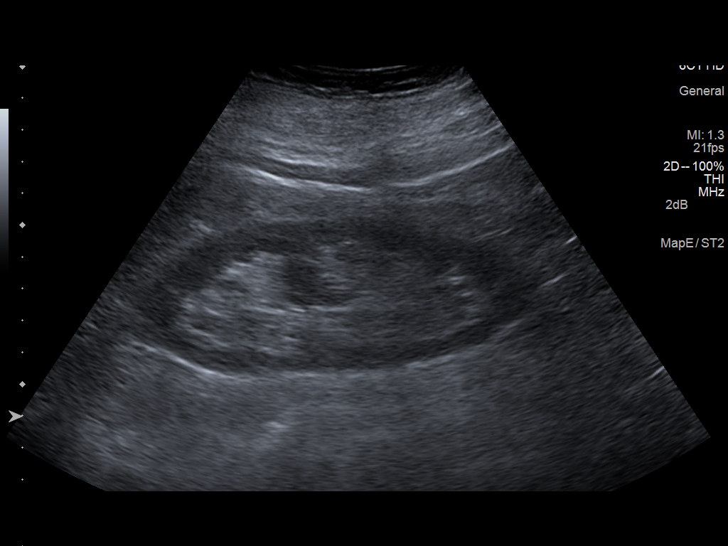
[im 4/18]
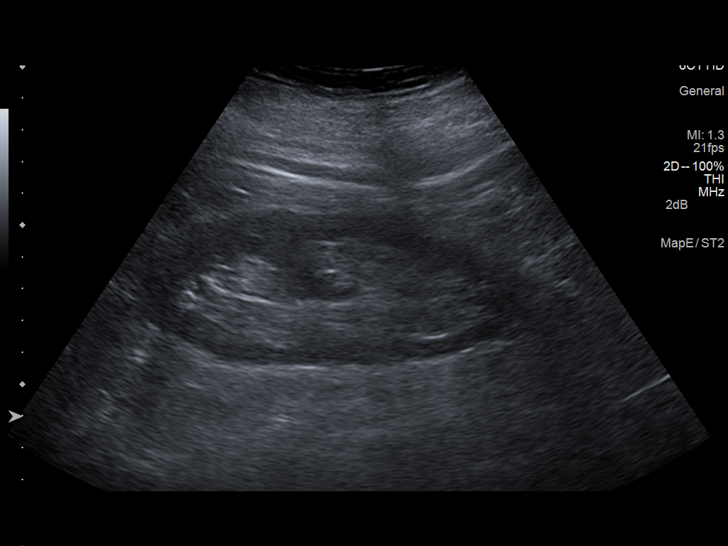
[im 5/18]
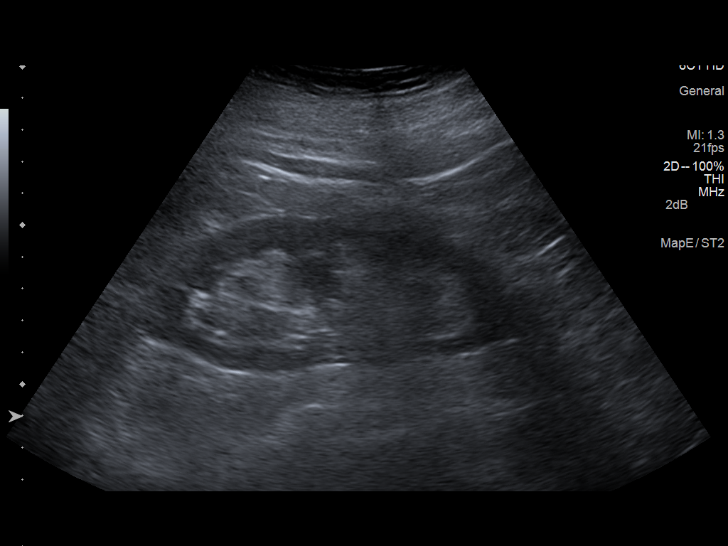
[im 6/18]
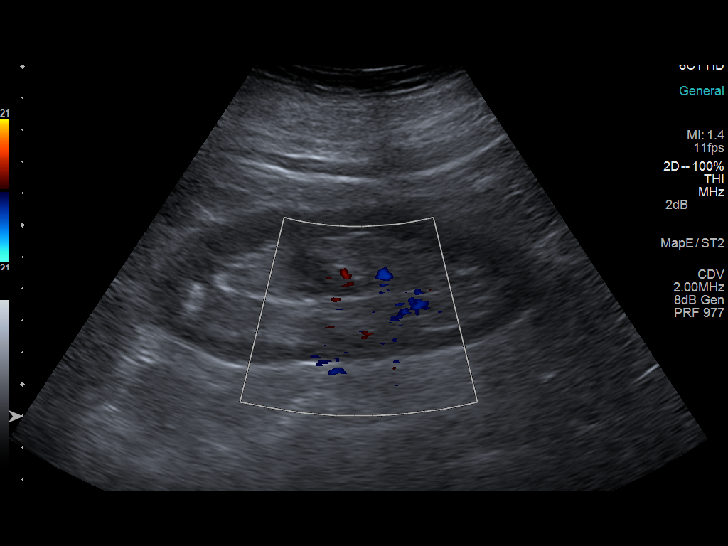
[im 8/18]
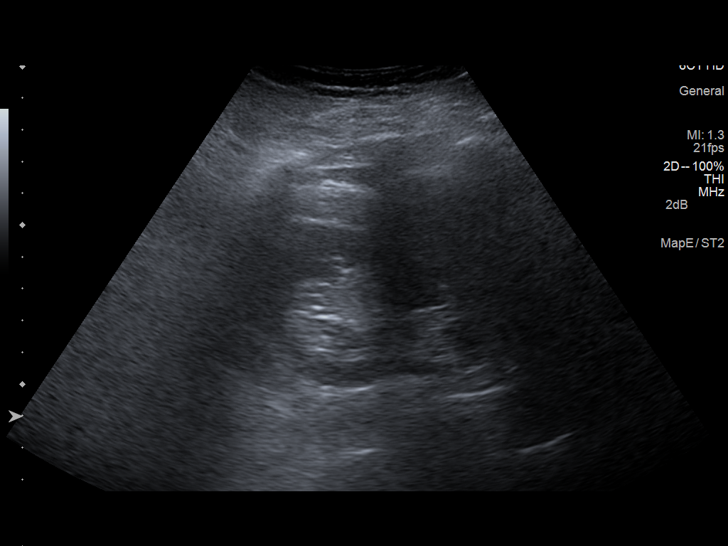
[im 9/18]
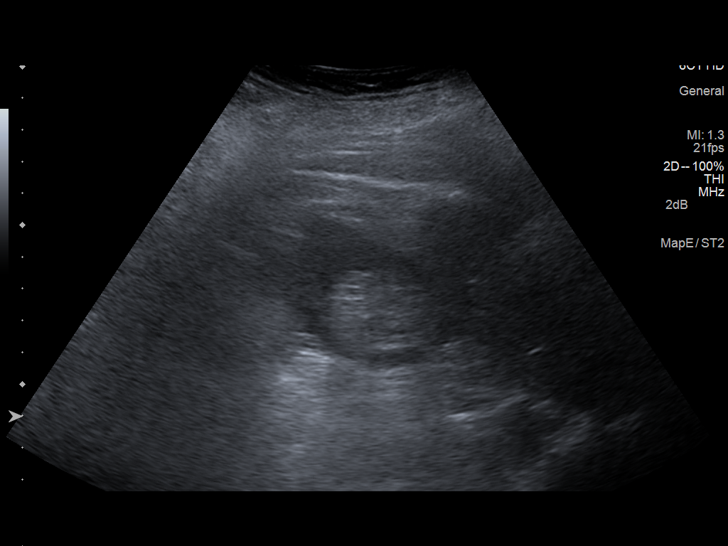
[im 10/18]
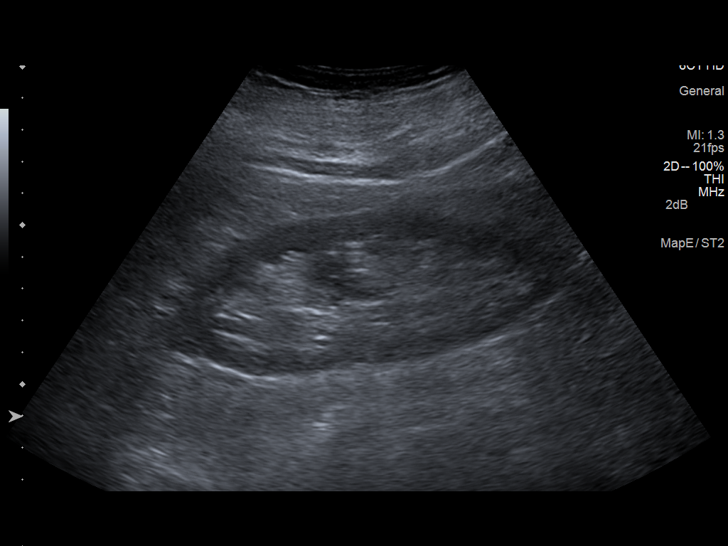
[im 11/18]
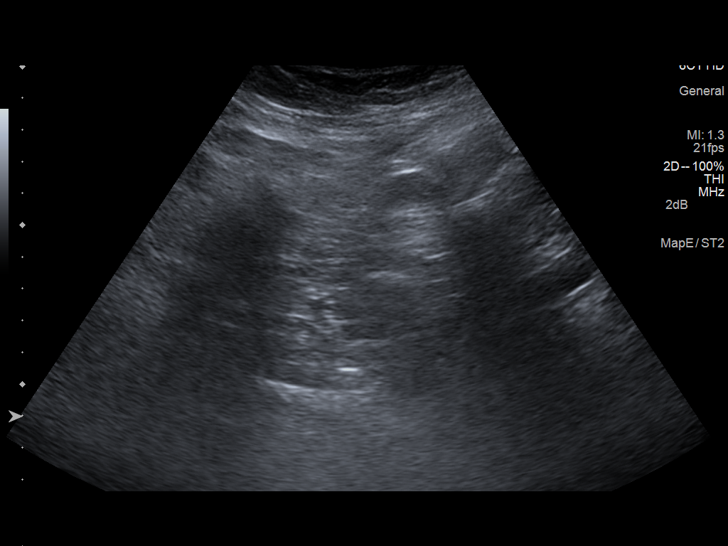
[im 13/18]
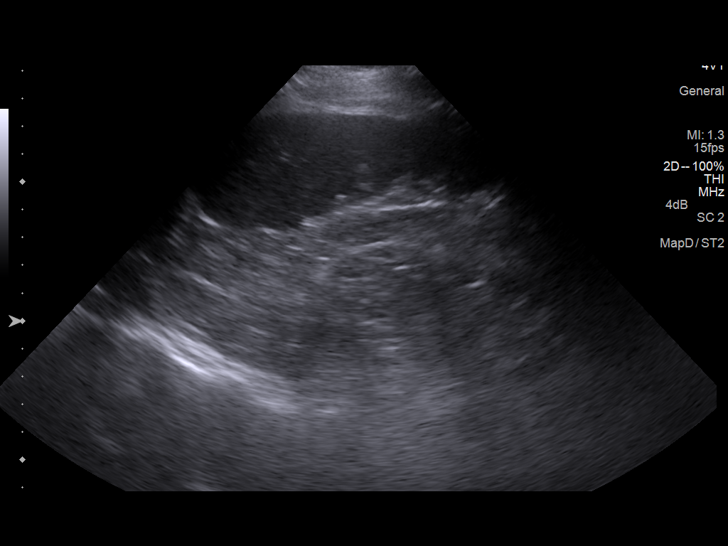
[im 14/18]
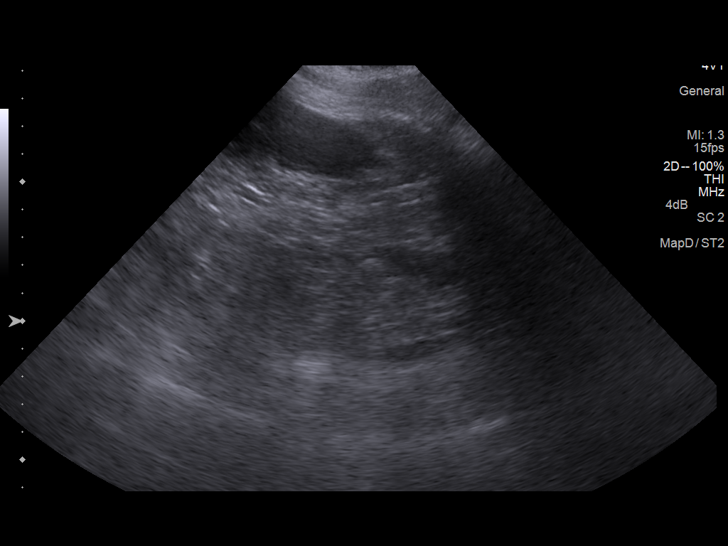
[im 15/18]
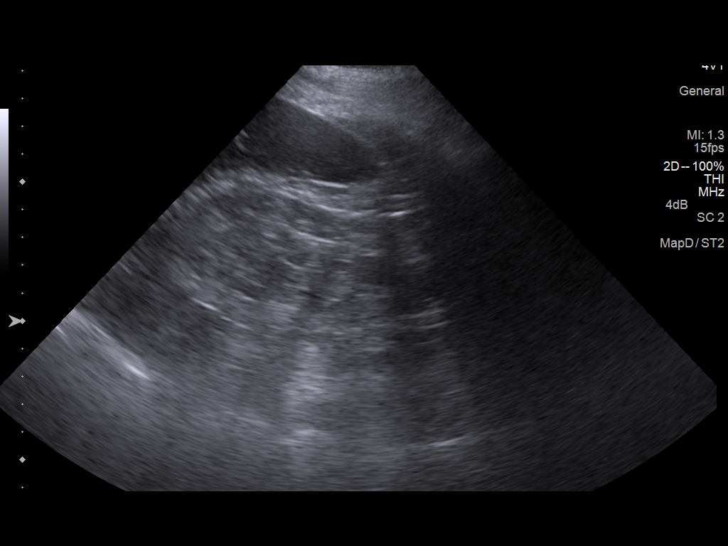
[im 17/18]
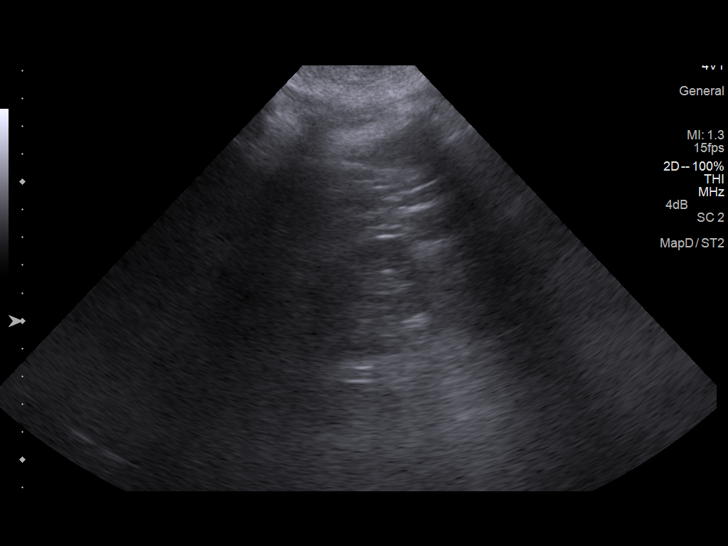
[im 18/18]
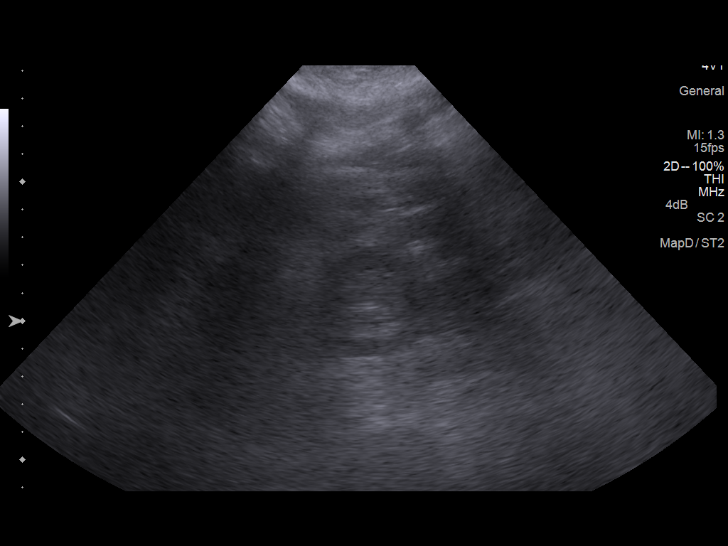

[14 of 18 positions shown; findings below may reference images not displayed]

FINDINGS: Right Kidney:

Length: 12.7 cm. Mild diffuse cortical thinning. No hydronephrosis
or mass.

Left Kidney:

Length: 6.5 cm. Diffuse parenchymal volume loss. No mass or
hydronephrosis visualized.

Bladder:

Not visualized.
IMPRESSION: 1. Atrophic left kidney.
2. Right renal cortical thinning/volume loss.
